# Patient Record
Sex: Female | Born: 1965
Health system: Southern US, Community
[De-identification: ages and names within clinical notes are randomized; demographics above are authoritative.]

## PROBLEM LIST (undated history)

## (undated) DIAGNOSIS — J189 Pneumonia, unspecified organism: Secondary | ICD-10-CM

## (undated) DIAGNOSIS — T7840XA Allergy, unspecified, initial encounter: Secondary | ICD-10-CM

## (undated) DIAGNOSIS — J479 Bronchiectasis, uncomplicated: Secondary | ICD-10-CM

## (undated) DIAGNOSIS — K219 Gastro-esophageal reflux disease without esophagitis: Secondary | ICD-10-CM

## (undated) DIAGNOSIS — D239 Other benign neoplasm of skin, unspecified: Secondary | ICD-10-CM

## (undated) HISTORY — PX: SHOULDER SURGERY: SHX246

## (undated) HISTORY — DX: Pneumonia, unspecified organism: J18.9

## (undated) HISTORY — DX: Gastro-esophageal reflux disease without esophagitis: K21.9

## (undated) HISTORY — PX: CARPAL TUNNEL RELEASE: SHX101

## (undated) HISTORY — PX: OTHER SURGICAL HISTORY: SHX169

## (undated) HISTORY — PX: HERNIA REPAIR: SHX51

## (undated) HISTORY — DX: Allergy, unspecified, initial encounter: T78.40XA

## (undated) HISTORY — DX: Other benign neoplasm of skin, unspecified: D23.9

## (undated) HISTORY — DX: Bronchiectasis, uncomplicated: J47.9

---

## 1998-02-04 ENCOUNTER — Emergency Department (HOSPITAL_COMMUNITY): Admission: EM | Admit: 1998-02-04 | Discharge: 1998-02-04 | Payer: Self-pay | Admitting: Emergency Medicine

## 1998-06-12 ENCOUNTER — Ambulatory Visit (HOSPITAL_COMMUNITY): Admission: RE | Admit: 1998-06-12 | Discharge: 1998-06-12 | Payer: Self-pay | Admitting: Obstetrics and Gynecology

## 1998-06-24 ENCOUNTER — Inpatient Hospital Stay (HOSPITAL_COMMUNITY): Admission: AD | Admit: 1998-06-24 | Discharge: 1998-06-24 | Payer: Self-pay | Admitting: Obstetrics and Gynecology

## 1998-08-14 ENCOUNTER — Ambulatory Visit (HOSPITAL_COMMUNITY): Admission: RE | Admit: 1998-08-14 | Discharge: 1998-08-14 | Payer: Self-pay | Admitting: Obstetrics and Gynecology

## 1998-10-23 ENCOUNTER — Inpatient Hospital Stay (HOSPITAL_COMMUNITY): Admission: AD | Admit: 1998-10-23 | Discharge: 1998-10-23 | Payer: Self-pay | Admitting: Obstetrics and Gynecology

## 1998-11-04 ENCOUNTER — Inpatient Hospital Stay (HOSPITAL_COMMUNITY): Admission: AD | Admit: 1998-11-04 | Discharge: 1998-11-04 | Payer: Self-pay | Admitting: Obstetrics and Gynecology

## 1998-11-06 ENCOUNTER — Inpatient Hospital Stay (HOSPITAL_COMMUNITY): Admission: AD | Admit: 1998-11-06 | Discharge: 1998-11-09 | Payer: Self-pay | Admitting: Obstetrics & Gynecology

## 1999-05-29 ENCOUNTER — Other Ambulatory Visit: Admission: RE | Admit: 1999-05-29 | Discharge: 1999-05-29 | Payer: Self-pay | Admitting: Family Medicine

## 1999-07-12 ENCOUNTER — Encounter: Payer: Self-pay | Admitting: Emergency Medicine

## 1999-07-12 ENCOUNTER — Emergency Department (HOSPITAL_COMMUNITY): Admission: EM | Admit: 1999-07-12 | Discharge: 1999-07-12 | Payer: Self-pay | Admitting: Emergency Medicine

## 2000-09-22 ENCOUNTER — Encounter: Admission: RE | Admit: 2000-09-22 | Discharge: 2000-11-05 | Payer: Self-pay | Admitting: Specialist

## 2005-01-09 ENCOUNTER — Emergency Department (HOSPITAL_COMMUNITY): Admission: EM | Admit: 2005-01-09 | Discharge: 2005-01-09 | Payer: Self-pay | Admitting: Emergency Medicine

## 2005-06-20 ENCOUNTER — Other Ambulatory Visit: Admission: RE | Admit: 2005-06-20 | Discharge: 2005-06-20 | Payer: Self-pay | Admitting: Family Medicine

## 2005-09-25 ENCOUNTER — Encounter: Admission: RE | Admit: 2005-09-25 | Discharge: 2005-09-25 | Payer: Self-pay | Admitting: Specialist

## 2009-10-24 ENCOUNTER — Encounter: Admission: RE | Admit: 2009-10-24 | Discharge: 2009-10-24 | Payer: Self-pay | Admitting: Family Medicine

## 2014-04-19 ENCOUNTER — Emergency Department (HOSPITAL_BASED_OUTPATIENT_CLINIC_OR_DEPARTMENT_OTHER)
Admission: EM | Admit: 2014-04-19 | Discharge: 2014-04-19 | Disposition: A | Discharge status: Home / Self Care | Payer: BC Managed Care – PPO | Attending: Emergency Medicine | Admitting: Emergency Medicine

## 2014-04-19 ENCOUNTER — Encounter (HOSPITAL_BASED_OUTPATIENT_CLINIC_OR_DEPARTMENT_OTHER): Payer: Self-pay | Admitting: Emergency Medicine

## 2014-04-19 DIAGNOSIS — W278XXA Contact with other nonpowered hand tool, initial encounter: Secondary | ICD-10-CM | POA: Insufficient documentation

## 2014-04-19 DIAGNOSIS — Z79899 Other long term (current) drug therapy: Secondary | ICD-10-CM | POA: Insufficient documentation

## 2014-04-19 DIAGNOSIS — S61209A Unspecified open wound of unspecified finger without damage to nail, initial encounter: Secondary | ICD-10-CM | POA: Insufficient documentation

## 2014-04-19 DIAGNOSIS — Z880 Allergy status to penicillin: Secondary | ICD-10-CM | POA: Insufficient documentation

## 2014-04-19 DIAGNOSIS — S61219A Laceration without foreign body of unspecified finger without damage to nail, initial encounter: Secondary | ICD-10-CM

## 2014-04-19 DIAGNOSIS — Y929 Unspecified place or not applicable: Secondary | ICD-10-CM | POA: Insufficient documentation

## 2014-04-19 DIAGNOSIS — Y9389 Activity, other specified: Secondary | ICD-10-CM | POA: Insufficient documentation

## 2014-04-19 DIAGNOSIS — Z23 Encounter for immunization: Secondary | ICD-10-CM | POA: Insufficient documentation

## 2014-04-19 MED ORDER — TETANUS-DIPHTH-ACELL PERTUSSIS 5-2.5-18.5 LF-MCG/0.5 IM SUSP
0.5000 mL | Freq: Once | INTRAMUSCULAR | Status: AC
  Administered 2014-04-19: 0.5 mL via INTRAMUSCULAR
  Filled 2014-04-19: qty 0.5

## 2014-04-19 NOTE — Discharge Instructions (Signed)
Sterile Tape Wound Care Some cuts and wounds can be closed using sterile tape, also called skin adhesive strips. Skin adhesive strips can be used for shallow (superficial) and simple cuts, wounds, lacerations, and surgical incisions. These strips act in place of stitches to hold the edges of the wound together, allowing for faster healing. Unlike stitches, the adhesive strips do not require needles or anesthetic medicine for placement. The strips will wear off naturally as the wound is healing. It is important to take proper care of your wound at home while it heals.  HOME CARE INSTRUCTIONS  Try to keep the area around your wound clean and dry. Do not allow the adhesive strips to get wet for the first 12 hours.   Do not use any soaps or ointments on the wound for the first 12 hours.   If a bandage (dressing) has been applied, follow your health care provider's instructions for how often to change the dressing. Keep the dressing dry if one has been applied.   Do not remove the adhesive strips. They will fall off on their own. If they do not, you may remove them gently after 10 days. You should gently wet the strips before removing them. For example, this can be done in the shower.  Do not scratch, pick, or rub the wound area.   Protect the wound from further injury until it is healed.   Protect the wound from sun and tanning bed exposure while it is healing and for several weeks after healing.   Only take over-the-counter or prescription medicines as directed by your health care provider.   Keep all follow-up appointments as directed by your health care provider.  SEEK MEDICAL CARE IF: Your adhesive strips become wet or soaked with blood before the wound has healed. The tape will need to be replaced.  SEEK IMMEDIATE MEDICAL CARE IF:  You have increasing pain in the wound.   You develop a rash after the strips are applied.  Your wound becomes red, swollen, hot, or tender.   You  have a red streak that goes away from the wound.   You have pus coming from the wound.   You have increased bleeding from the wound.  You notice a bad smell coming from the wound.   Your wound breaks open. MAKE SURE YOU:  Understand these instructions.  Will watch your condition.  Will get help right away if you are not doing well or get worse. Document Released: 10/17/2004 Document Revised: 06/30/2013 Document Reviewed: 03/31/2013 ExitCare Patient Information 2015 [employer]. This information is not intended to replace advice given to you by your health care provider. Make sure you discuss any questions you have with your health care provider.  

## 2014-04-19 NOTE — ED Notes (Signed)
Pt reports was getting tylenol out and did not realize there was an axe stored in area.  Sustained laceration to right fourth digit. Bleeding currently controlled with dressing in place.

## 2014-04-19 NOTE — ED Provider Notes (Signed)
CSN: [id number]     Arrival date & time 04/19/14  2017 History   First MD Initiated Contact with Patient 04/19/14 2053     Chief Complaint  Patient presents with  . Extremity Laceration     (Consider location/radiation/quality/duration/timing/severity/associated sxs/prior Treatment) Patient is a 48 y.o. female presenting with hand pain. The history is provided by the patient. No language interpreter was used.  Hand Pain This is a new problem. The current episode started today. The problem occurs constantly. The problem has been unchanged. Pertinent negatives include no numbness. Nothing aggravates the symptoms. She has tried nothing for the symptoms. The treatment provided no relief.  Pt reports she cut her hand on an axe.   Pt complains of laceration to right ring finger History reviewed. No pertinent past medical history. Past Surgical History  Procedure Laterality Date  . Cesarean section    . Carpal tunnel release    . Shoulder surgery    . Uterine ablation     No family history on file. History  Substance Use Topics  . Smoking status: Never Smoker   . Smokeless tobacco: Not on file  . Alcohol Use: No   OB History   Grav Para Term Preterm Abortions TAB SAB Ect Mult Living                 Review of Systems  Neurological: Negative for numbness.  All other systems reviewed and are negative.     Allergies  Ampicillin and Erythromycin  Home Medications   Prior to Admission medications   Medication Sig Start Date End Date Taking? Authorizing Provider  cetirizine (ZYRTEC) 10 MG tablet Take 10 mg by mouth daily.   Yes Historical Provider, MD  Multiple Vitamin (MULTIVITAMIN) tablet Take 1 tablet by mouth daily.   Yes Historical Provider, MD   BP 115/75  Pulse 76  Temp(Src) 98.3 F (36.8 C) (Oral)  Resp 18  Ht 5\' 3"  (1.6 m)  Wt 165 lb (74.844 kg)  BMI 29.24 kg/m2  SpO2 100% Physical Exam  Constitutional: She is oriented to person, place, and time. She appears  well-developed and well-nourished.  HENT:  Head: Normocephalic.  Cardiovascular: Normal rate.   Pulmonary/Chest: Effort normal.  Musculoskeletal: She exhibits tenderness.  59mm round laceration flap no gapping  nv and ns intact  Neurological: She is alert and oriented to person, place, and time. She has normal reflexes.  Skin: Skin is warm.  Psychiatric: She has a normal mood and affect.    ED Course  Procedures (including critical care time) Labs Review Labs Reviewed - No data to display  Imaging Review No results found.   EKG Interpretation None      MDM   Final diagnoses:  Laceration of finger, right, initial encounter    Tetanus,  steristrips to wound    Fransico Meadow, PA-C 04/19/14 2119

## 2014-04-20 NOTE — ED Provider Notes (Signed)
Medical screening examination/treatment/procedure(s) were performed by non-physician practitioner and as supervising physician I was immediately available for consultation/collaboration.   EKG Interpretation None        Blanchard Kelch, MD 04/20/14 1032

## 2014-06-20 ENCOUNTER — Other Ambulatory Visit: Payer: Self-pay | Admitting: Family Medicine

## 2014-06-20 DIAGNOSIS — R1084 Generalized abdominal pain: Secondary | ICD-10-CM

## 2014-06-24 ENCOUNTER — Ambulatory Visit
Admission: RE | Admit: 2014-06-24 | Discharge: 2014-06-24 | Disposition: A | Discharge status: Home / Self Care | Payer: BC Managed Care – PPO | Source: Ambulatory Visit | Attending: Family Medicine | Admitting: Family Medicine

## 2014-06-24 DIAGNOSIS — R1084 Generalized abdominal pain: Secondary | ICD-10-CM

## 2016-02-13 DIAGNOSIS — D2261 Melanocytic nevi of right upper limb, including shoulder: Secondary | ICD-10-CM | POA: Diagnosis not present

## 2016-02-13 DIAGNOSIS — L700 Acne vulgaris: Secondary | ICD-10-CM | POA: Diagnosis not present

## 2016-02-13 DIAGNOSIS — D2272 Melanocytic nevi of left lower limb, including hip: Secondary | ICD-10-CM | POA: Diagnosis not present

## 2016-02-13 DIAGNOSIS — D2271 Melanocytic nevi of right lower limb, including hip: Secondary | ICD-10-CM | POA: Diagnosis not present

## 2016-03-13 DIAGNOSIS — Z1231 Encounter for screening mammogram for malignant neoplasm of breast: Secondary | ICD-10-CM | POA: Diagnosis not present

## 2016-03-13 DIAGNOSIS — Z01419 Encounter for gynecological examination (general) (routine) without abnormal findings: Secondary | ICD-10-CM | POA: Diagnosis not present

## 2016-03-13 DIAGNOSIS — N951 Menopausal and female climacteric states: Secondary | ICD-10-CM | POA: Diagnosis not present

## 2016-03-13 DIAGNOSIS — Z6831 Body mass index (BMI) 31.0-31.9, adult: Secondary | ICD-10-CM | POA: Diagnosis not present

## 2016-12-27 ENCOUNTER — Encounter (INDEPENDENT_AMBULATORY_CARE_PROVIDER_SITE_OTHER): Payer: Self-pay | Admitting: *Deleted

## 2016-12-27 NOTE — Telephone Encounter (Addendum)
Error

## 2016-12-27 NOTE — Telephone Encounter (Signed)
This encounter was created in error - please disregard.

## 2017-02-07 ENCOUNTER — Emergency Department (HOSPITAL_BASED_OUTPATIENT_CLINIC_OR_DEPARTMENT_OTHER)
Admission: EM | Admit: 2017-02-07 | Discharge: 2017-02-07 | Disposition: A | Discharge status: Home / Self Care | Payer: BLUE CROSS/BLUE SHIELD | Attending: Emergency Medicine | Admitting: Emergency Medicine

## 2017-02-07 ENCOUNTER — Encounter (HOSPITAL_BASED_OUTPATIENT_CLINIC_OR_DEPARTMENT_OTHER): Payer: Self-pay | Admitting: Emergency Medicine

## 2017-02-07 ENCOUNTER — Emergency Department (HOSPITAL_BASED_OUTPATIENT_CLINIC_OR_DEPARTMENT_OTHER): Payer: BLUE CROSS/BLUE SHIELD

## 2017-02-07 DIAGNOSIS — R1031 Right lower quadrant pain: Secondary | ICD-10-CM | POA: Diagnosis not present

## 2017-02-07 DIAGNOSIS — N3000 Acute cystitis without hematuria: Secondary | ICD-10-CM | POA: Diagnosis not present

## 2017-02-07 LAB — COMPREHENSIVE METABOLIC PANEL
ALBUMIN: 3.8 g/dL (ref 3.5–5.0)
ALT: 14 U/L (ref 14–54)
AST: 20 U/L (ref 15–41)
Alkaline Phosphatase: 107 U/L (ref 38–126)
Anion gap: 8 (ref 5–15)
BILIRUBIN TOTAL: 0.2 mg/dL — AB (ref 0.3–1.2)
BUN: 21 mg/dL — AB (ref 6–20)
CO2: 24 mmol/L (ref 22–32)
Calcium: 9.1 mg/dL (ref 8.9–10.3)
Chloride: 104 mmol/L (ref 101–111)
Creatinine, Ser: 0.75 mg/dL (ref 0.44–1.00)
GFR calc Af Amer: >60 mL/min (ref >60)
GFR calc non Af Amer: >60 mL/min (ref >60)
Glucose, Bld: 102 mg/dL — ABNORMAL HIGH (ref 65–99)
Potassium: 4 mmol/L (ref 3.5–5.1)
Sodium: 136 mmol/L (ref 135–145)
TOTAL PROTEIN: 6.9 g/dL (ref 6.5–8.1)

## 2017-02-07 LAB — URINALYSIS, ROUTINE W REFLEX MICROSCOPIC
Bilirubin Urine: NEGATIVE
GLUCOSE, UA: NEGATIVE mg/dL
Ketones, ur: NEGATIVE mg/dL
Nitrite: NEGATIVE
PROTEIN: NEGATIVE mg/dL
Specific Gravity, Urine: 1.021 (ref 1.005–1.030)
pH: 5.5 (ref 5.0–8.0)

## 2017-02-07 LAB — CBC
HCT: 41 % (ref 36.0–46.0)
Hemoglobin: 14 g/dL (ref 12.0–15.0)
MCH: 31.9 pg (ref 26.0–34.0)
MCHC: 34.1 g/dL (ref 30.0–36.0)
MCV: 93.4 fL (ref 78.0–100.0)
Platelets: 318 10*3/uL (ref 150–400)
RBC: 4.39 MIL/uL (ref 3.87–5.11)
RDW: 12.6 % (ref 11.5–15.5)
WBC: 9.3 10*3/uL (ref 4.0–10.5)

## 2017-02-07 LAB — URINALYSIS, MICROSCOPIC (REFLEX)

## 2017-02-07 LAB — LIPASE, BLOOD: Lipase: 21 U/L (ref 11–51)

## 2017-02-07 MED ORDER — KETOROLAC TROMETHAMINE 30 MG/ML IJ SOLN: 30.0000 mg | Freq: Once | INTRAMUSCULAR | Status: DC | PRN

## 2017-02-07 MED ORDER — NITROFURANTOIN MONOHYD MACRO 100 MG PO CAPS: 100.0000 mg | ORAL_CAPSULE | Freq: Two times a day (BID) | ORAL | 0 refills | Status: AC

## 2017-02-07 MED ORDER — IOPAMIDOL (ISOVUE-300) INJECTION 61%
100.0000 mL | Freq: Once | INTRAVENOUS | Status: AC | PRN
  Administered 2017-02-07: 100 mL via INTRAVENOUS

## 2017-02-07 MED ORDER — ONDANSETRON 8 MG PO TBDP
8.0000 mg | ORAL_TABLET | Freq: Once | ORAL | Status: AC
  Administered 2017-02-07: 8 mg via ORAL
  Filled 2017-02-07: qty 1

## 2017-02-07 NOTE — Discharge Instructions (Signed)
Please read instructions below. Take your antibiotic 2 times per day until they are gone. You can take advil or tylenol as needed for pain. Follow up with your primary care provider about your abdominal pain. Return to the ER if you develop a fever, stop having bowel movements, or for new or worsening symptoms.

## 2017-02-07 NOTE — ED Notes (Signed)
Pt does not want a pelvic exam, EDP aware

## 2017-02-07 NOTE — ED Notes (Signed)
Pt ambulatory to bathroom

## 2017-02-07 NOTE — ED Triage Notes (Addendum)
Sent from PCP, RLQ since 430 this morning with nausea. Negative markle sign

## 2017-02-07 NOTE — ED Notes (Signed)
Pt verbalizes understanding of d/c instructions and denies any further needs at this time. 

## 2017-02-07 NOTE — ED Provider Notes (Signed)
[place] [facility] Provider Note   CSN: [id number] Arrival date & time: 02/07/17  1739   By signing my name below, I, Eunice Blase, attest that this documentation has been prepared under the direction and in the presence of [place] R Russo, PA-C. Electronically Signed: Eunice Blase, Scribe. 02/07/17. 8:27 PM.   History   Chief Complaint Chief Complaint  Patient presents with  . Abdominal Pain   The history is provided by the patient and medical records. No language interpreter was used.    Monique Wang is a 51 y.o. female with h/o uterine ablation, kidney stones, hernia and C-section who presents to the Emergency Department with concern for RLQ abdominal pain onset 0430 today. Nausea noted since lunch today; decreased appetite, urinary frequency, mild fever that subsided without intervention also noted. Pt evaluated by PCP PTA and advised to report to [facility] ED to R/O any emergent situation. She describes a dull 3/10 ache at rest that becomes moderate and sharp with certain movements and cough. No pain medications taken at home. No other modifying factors noted.  Pt using estrogen hormone patch, zyrtec and multivitamin supplement daily. No diarrhea, vaginal bleeding, chills, N/V, SOB, headache, weakness, constipation, chronic disorders and bloody stool. No hx cholecystectomy or appendectomy. No other complaints at this time.   History reviewed. No pertinent past medical history.  There are no active problems to display for this patient.   Past Surgical History:  Procedure Laterality Date  . CARPAL TUNNEL RELEASE    . CESAREAN SECTION    . HERNIA REPAIR    . SHOULDER SURGERY    . uterine ablation      OB History    No data available       Home Medications    Prior to Admission medications   Medication Sig Start Date End Date Taking? Authorizing Provider  cetirizine (ZYRTEC) 10 MG tablet Take 10 mg by mouth daily.    [provider]  Multiple Vitamin  (MULTIVITAMIN) tablet Take 1 tablet by mouth daily.    [provider]  nitrofurantoin, macrocrystal-monohydrate, (MACROBID) 100 MG capsule Take 1 capsule (100 mg total) by mouth 2 (two) times daily. 02/07/17 02/14/17  Russo, [place] N, PA-C    Family History No family history on file.  Social History Social History  Substance Use Topics  . Smoking status: Never Smoker  . Smokeless tobacco: Never Used  . Alcohol use No     Allergies   Ampicillin and Erythromycin   Review of Systems Review of Systems  Constitutional: Positive for appetite change and fever. Negative for chills.  Respiratory: Negative for shortness of breath.   Cardiovascular: Negative for chest pain.  Gastrointestinal: Positive for abdominal pain and nausea. Negative for constipation and diarrhea.  Endocrine: Negative for polydipsia.  Genitourinary: Positive for frequency. Negative for difficulty urinating and vaginal bleeding.  Musculoskeletal: Negative for back pain.  Skin: Negative for color change.  Allergic/Immunologic: Negative for immunocompromised state.  Neurological: Negative for weakness and headaches.     Physical Exam Updated Vital Signs BP 120/74 (BP Location: Left Arm)   Pulse 70   Temp 98.6 F (37 C) (Oral)   Resp 12   Ht 5\' 3"  (1.6 m)   Wt 171 lb (77.6 kg)   SpO2 99%   BMI 30.29 kg/m   Physical Exam  Constitutional: She appears well-developed and well-nourished.  HENT:  Head: Normocephalic and atraumatic.  Eyes: Conjunctivae are normal. Pupils are equal, round, and reactive to light.  Neck: Normal range of motion. Neck supple.  Cardiovascular: Normal rate, regular rhythm, normal heart sounds and intact distal pulses.   Pulmonary/Chest: Effort normal and breath sounds normal. She has no wheezes. She has no rales.  Abdominal: Soft. Bowel sounds are normal. She exhibits no distension. There is tenderness (mild) in the right lower quadrant and periumbilical area. There is no  rebound, no guarding and no CVA tenderness (bilaterally).  Peritoneal signs  Neurological: She is alert.  Skin: Skin is warm.  Psychiatric: She has a normal mood and affect. Her behavior is normal.  Nursing note and vitals reviewed.    ED Treatments / Results  DIAGNOSTIC STUDIES: Oxygen Saturation is 99% on RA, NL by my interpretation.    COORDINATION OF CARE: 8:07 PM-Discussed next steps with pt. Pt verbalized understanding and is agreeable with the plan. Will order imaging, labs and PRN pain medication.   Labs (all labs ordered are listed, but only abnormal results are displayed) Labs Reviewed  URINALYSIS, ROUTINE W REFLEX MICROSCOPIC - Abnormal; Notable for the following:       Result Value   APPearance CLOUDY (*)    Hgb urine dipstick MODERATE (*)    Leukocytes, UA LARGE (*)    All other components within normal limits  URINALYSIS, MICROSCOPIC (REFLEX) - Abnormal; Notable for the following:    Bacteria, UA FEW (*)    Squamous Epithelial / LPF 0-5 (*)    All other components within normal limits  COMPREHENSIVE METABOLIC PANEL - Abnormal; Notable for the following:    Glucose, Bld 102 (*)    BUN 21 (*)    Total Bilirubin 0.2 (*)    All other components within normal limits  LIPASE, BLOOD  CBC    EKG  EKG Interpretation None       Radiology Ct Abdomen Pelvis W Contrast  Result Date: 02/07/2017 CLINICAL DATA:  Right lower quadrant pain with nausea EXAM: CT ABDOMEN AND PELVIS WITH CONTRAST TECHNIQUE: Multidetector CT imaging of the abdomen and pelvis was performed using the standard protocol following bolus administration of intravenous contrast. CONTRAST:  114mL ISOVUE-300 IOPAMIDOL (ISOVUE-300) INJECTION 61% COMPARISON:  01/09/2005 FINDINGS: Lower chest: Lung bases demonstrate no acute consolidation or effusion. Normal heart size. Hepatobiliary: No focal liver abnormality is seen. No gallstones, gallbladder wall thickening, or biliary dilatation. Pancreas:  Unremarkable. No pancreatic ductal dilatation or surrounding inflammatory changes. Spleen: Normal in size without focal abnormality. Adrenals/Urinary Tract: Adrenal glands are unremarkable. Kidneys are normal, without renal calculi, focal lesion, or hydronephrosis. Bladder is unremarkable. Stomach/Bowel: The stomach is nonenlarged. There is no dilated small bowel. No colon wall thickening. Probable normal appendix on sagittal views, series 6, image number 28. No right lower quadrant inflammatory process. Vascular/Lymphatic: No significant vascular findings are present. No enlarged abdominal or pelvic lymph nodes. Reproductive: Uterus and bilateral adnexa are unremarkable. Other: No free air or free fluid. Musculoskeletal: No acute or significant osseous findings. IMPRESSION: No definite CT evidence for acute intra-abdominal or pelvic pathology. Electronically Signed   By: Donavan Foil M.D.   On: 02/07/2017 21:42    Procedures Procedures (including critical care time)  Medications Ordered in ED Medications  ketorolac (TORADOL) 30 MG/ML injection 30 mg (not administered)  ondansetron (ZOFRAN-ODT) disintegrating tablet 8 mg (8 mg Oral Given 02/07/17 2017)  iopamidol (ISOVUE-300) 61 % injection 100 mL (100 mLs Intravenous Contrast Given 02/07/17 2119)     Initial Impression / Assessment and Plan / ED Course  I have reviewed the triage vital  signs and the nursing notes.  Pertinent labs & imaging results that were available during my care of the patient were reviewed by me and considered in my medical decision making (see chart for details).     Pt w RLQ abdominal pain and UTI. Patient is nontoxic, nonseptic appearing, in no apparent distress.  Patient's pain and other symptoms adequately managed in emergency department.  Labs, imaging and vitals reviewed.  Patient does not meet the SIRS or Sepsis criteria.  On repeat exam patient does not have a surgical abdomen.  CT neg for appendicitis, bowel  obstruction, bowel perforation, cholecystitis, diverticulitis. Pt refused pelvic exam and requesting to leave. Will tx UTI with macrobid. Patient discharged home with symptomatic treatment and given strict instructions for follow-up with their primary care physician.  Pt safe for discharge. Patient discussed with Dr. Johnney Killian. Discussed results, findings, treatment and follow up. Patient advised of return precautions. Patient verbalized understanding and agreed with plan.   Final Clinical Impressions(s) / ED Diagnoses   Final diagnoses:  RLQ abdominal pain  Acute cystitis without hematuria    New Prescriptions Discharge Medication List as of 02/07/2017 10:28 PM    START taking these medications   Details  nitrofurantoin, macrocrystal-monohydrate, (MACROBID) 100 MG capsule Take 1 capsule (100 mg total) by mouth 2 (two) times daily., Starting Fri 02/07/2017, Until Fri 02/14/2017, Print      I personally performed the services described in this documentation, which was scribed in my presence. The recorded information has been reviewed and is accurate.    Russo, [place] N, PA-C 02/07/17 2254    Charlesetta Shanks, MD 02/08/17 [phone]

## 2017-03-17 DIAGNOSIS — Z6830 Body mass index (BMI) 30.0-30.9, adult: Secondary | ICD-10-CM | POA: Diagnosis not present

## 2017-03-17 DIAGNOSIS — Z1231 Encounter for screening mammogram for malignant neoplasm of breast: Secondary | ICD-10-CM | POA: Diagnosis not present

## 2017-03-17 DIAGNOSIS — R8299 Other abnormal findings in urine: Secondary | ICD-10-CM | POA: Diagnosis not present

## 2017-03-17 DIAGNOSIS — N390 Urinary tract infection, site not specified: Secondary | ICD-10-CM | POA: Diagnosis not present

## 2017-03-17 DIAGNOSIS — Z01419 Encounter for gynecological examination (general) (routine) without abnormal findings: Secondary | ICD-10-CM | POA: Diagnosis not present

## 2017-04-15 DIAGNOSIS — R87615 Unsatisfactory cytologic smear of cervix: Secondary | ICD-10-CM | POA: Diagnosis not present

## 2017-08-27 DIAGNOSIS — D225 Melanocytic nevi of trunk: Secondary | ICD-10-CM | POA: Diagnosis not present

## 2017-08-27 DIAGNOSIS — D2261 Melanocytic nevi of right upper limb, including shoulder: Secondary | ICD-10-CM | POA: Diagnosis not present

## 2017-08-27 DIAGNOSIS — L732 Hidradenitis suppurativa: Secondary | ICD-10-CM | POA: Diagnosis not present

## 2017-08-27 DIAGNOSIS — Z86018 Personal history of other benign neoplasm: Secondary | ICD-10-CM | POA: Diagnosis not present

## 2018-02-06 DIAGNOSIS — L309 Dermatitis, unspecified: Secondary | ICD-10-CM | POA: Diagnosis not present

## 2018-02-06 DIAGNOSIS — L989 Disorder of the skin and subcutaneous tissue, unspecified: Secondary | ICD-10-CM | POA: Diagnosis not present

## 2018-02-24 DIAGNOSIS — L309 Dermatitis, unspecified: Secondary | ICD-10-CM | POA: Diagnosis not present

## 2018-02-24 DIAGNOSIS — L920 Granuloma annulare: Secondary | ICD-10-CM | POA: Diagnosis not present

## 2018-03-17 DIAGNOSIS — Z0142 Encounter for cervical smear to confirm findings of recent normal smear following initial abnormal smear: Secondary | ICD-10-CM | POA: Diagnosis not present

## 2018-03-17 DIAGNOSIS — Z1231 Encounter for screening mammogram for malignant neoplasm of breast: Secondary | ICD-10-CM | POA: Diagnosis not present

## 2018-03-17 DIAGNOSIS — Z01419 Encounter for gynecological examination (general) (routine) without abnormal findings: Secondary | ICD-10-CM | POA: Diagnosis not present

## 2018-03-17 DIAGNOSIS — Z6831 Body mass index (BMI) 31.0-31.9, adult: Secondary | ICD-10-CM | POA: Diagnosis not present

## 2018-03-31 DIAGNOSIS — R87615 Unsatisfactory cytologic smear of cervix: Secondary | ICD-10-CM | POA: Diagnosis not present

## 2018-08-21 DIAGNOSIS — J180 Bronchopneumonia, unspecified organism: Secondary | ICD-10-CM | POA: Diagnosis not present

## 2018-09-18 DIAGNOSIS — R05 Cough: Secondary | ICD-10-CM | POA: Diagnosis not present

## 2018-09-18 DIAGNOSIS — J069 Acute upper respiratory infection, unspecified: Secondary | ICD-10-CM | POA: Diagnosis not present

## 2018-10-22 DIAGNOSIS — H903 Sensorineural hearing loss, bilateral: Secondary | ICD-10-CM | POA: Diagnosis not present

## 2018-10-22 DIAGNOSIS — H6981 Other specified disorders of Eustachian tube, right ear: Secondary | ICD-10-CM | POA: Diagnosis not present

## 2019-02-15 DIAGNOSIS — Z23 Encounter for immunization: Secondary | ICD-10-CM | POA: Diagnosis not present

## 2019-02-15 DIAGNOSIS — S80212A Abrasion, left knee, initial encounter: Secondary | ICD-10-CM | POA: Diagnosis not present

## 2019-04-01 DIAGNOSIS — Z01419 Encounter for gynecological examination (general) (routine) without abnormal findings: Secondary | ICD-10-CM | POA: Diagnosis not present

## 2019-04-01 DIAGNOSIS — Z6831 Body mass index (BMI) 31.0-31.9, adult: Secondary | ICD-10-CM | POA: Diagnosis not present

## 2019-04-01 DIAGNOSIS — Z1231 Encounter for screening mammogram for malignant neoplasm of breast: Secondary | ICD-10-CM | POA: Diagnosis not present

## 2019-05-14 DIAGNOSIS — M76891 Other specified enthesopathies of right lower limb, excluding foot: Secondary | ICD-10-CM | POA: Diagnosis not present

## 2019-05-14 DIAGNOSIS — M7062 Trochanteric bursitis, left hip: Secondary | ICD-10-CM | POA: Diagnosis not present

## 2019-05-14 DIAGNOSIS — M25551 Pain in right hip: Secondary | ICD-10-CM | POA: Diagnosis not present

## 2019-05-14 DIAGNOSIS — M7061 Trochanteric bursitis, right hip: Secondary | ICD-10-CM | POA: Diagnosis not present

## 2019-05-27 DIAGNOSIS — M25551 Pain in right hip: Secondary | ICD-10-CM | POA: Diagnosis not present

## 2019-05-27 DIAGNOSIS — M25552 Pain in left hip: Secondary | ICD-10-CM | POA: Diagnosis not present

## 2019-06-07 DIAGNOSIS — Z0142 Encounter for cervical smear to confirm findings of recent normal smear following initial abnormal smear: Secondary | ICD-10-CM | POA: Diagnosis not present

## 2019-06-07 DIAGNOSIS — R87615 Unsatisfactory cytologic smear of cervix: Secondary | ICD-10-CM | POA: Diagnosis not present

## 2019-06-19 DIAGNOSIS — L255 Unspecified contact dermatitis due to plants, except food: Secondary | ICD-10-CM | POA: Diagnosis not present

## 2019-07-01 DIAGNOSIS — L237 Allergic contact dermatitis due to plants, except food: Secondary | ICD-10-CM | POA: Diagnosis not present

## 2019-09-22 DIAGNOSIS — Z86018 Personal history of other benign neoplasm: Secondary | ICD-10-CM | POA: Diagnosis not present

## 2019-09-22 DIAGNOSIS — D225 Melanocytic nevi of trunk: Secondary | ICD-10-CM | POA: Diagnosis not present

## 2019-09-22 DIAGNOSIS — D2261 Melanocytic nevi of right upper limb, including shoulder: Secondary | ICD-10-CM | POA: Diagnosis not present

## 2019-09-22 DIAGNOSIS — D2271 Melanocytic nevi of right lower limb, including hip: Secondary | ICD-10-CM | POA: Diagnosis not present

## 2019-12-29 DIAGNOSIS — Z20828 Contact with and (suspected) exposure to other viral communicable diseases: Secondary | ICD-10-CM | POA: Diagnosis not present

## 2019-12-31 DIAGNOSIS — Z20822 Contact with and (suspected) exposure to covid-19: Secondary | ICD-10-CM | POA: Diagnosis not present

## 2020-01-03 DIAGNOSIS — U071 COVID-19: Secondary | ICD-10-CM | POA: Diagnosis not present

## 2020-01-10 DIAGNOSIS — U071 COVID-19: Secondary | ICD-10-CM | POA: Diagnosis not present

## 2020-01-10 DIAGNOSIS — R05 Cough: Secondary | ICD-10-CM | POA: Diagnosis not present

## 2020-01-10 DIAGNOSIS — Z9189 Other specified personal risk factors, not elsewhere classified: Secondary | ICD-10-CM | POA: Diagnosis not present

## 2020-01-13 DIAGNOSIS — R0602 Shortness of breath: Secondary | ICD-10-CM | POA: Diagnosis not present

## 2020-01-13 DIAGNOSIS — J069 Acute upper respiratory infection, unspecified: Secondary | ICD-10-CM | POA: Diagnosis not present

## 2020-01-13 DIAGNOSIS — Z1159 Encounter for screening for other viral diseases: Secondary | ICD-10-CM | POA: Diagnosis not present

## 2020-01-13 DIAGNOSIS — R05 Cough: Secondary | ICD-10-CM | POA: Diagnosis not present

## 2020-01-22 DIAGNOSIS — U071 COVID-19: Secondary | ICD-10-CM

## 2020-01-22 HISTORY — DX: COVID-19: U07.1

## 2020-01-24 DIAGNOSIS — R0600 Dyspnea, unspecified: Secondary | ICD-10-CM | POA: Diagnosis not present

## 2020-01-24 DIAGNOSIS — R0602 Shortness of breath: Secondary | ICD-10-CM | POA: Diagnosis not present

## 2020-01-24 DIAGNOSIS — U071 COVID-19: Secondary | ICD-10-CM | POA: Diagnosis not present

## 2020-01-24 DIAGNOSIS — J189 Pneumonia, unspecified organism: Secondary | ICD-10-CM | POA: Diagnosis not present

## 2020-01-25 DIAGNOSIS — U071 COVID-19: Secondary | ICD-10-CM | POA: Diagnosis not present

## 2020-01-31 DIAGNOSIS — R9431 Abnormal electrocardiogram [ECG] [EKG]: Secondary | ICD-10-CM | POA: Diagnosis not present

## 2020-01-31 DIAGNOSIS — R002 Palpitations: Secondary | ICD-10-CM | POA: Diagnosis not present

## 2020-01-31 DIAGNOSIS — R0602 Shortness of breath: Secondary | ICD-10-CM | POA: Diagnosis not present

## 2020-01-31 DIAGNOSIS — B948 Sequelae of other specified infectious and parasitic diseases: Secondary | ICD-10-CM | POA: Diagnosis not present

## 2020-01-31 DIAGNOSIS — U071 COVID-19: Secondary | ICD-10-CM | POA: Diagnosis not present

## 2020-01-31 DIAGNOSIS — R0600 Dyspnea, unspecified: Secondary | ICD-10-CM | POA: Diagnosis not present

## 2020-02-14 DIAGNOSIS — R05 Cough: Secondary | ICD-10-CM | POA: Diagnosis not present

## 2020-02-14 DIAGNOSIS — J189 Pneumonia, unspecified organism: Secondary | ICD-10-CM | POA: Diagnosis not present

## 2020-02-14 DIAGNOSIS — R0600 Dyspnea, unspecified: Secondary | ICD-10-CM | POA: Diagnosis not present

## 2020-02-14 DIAGNOSIS — R002 Palpitations: Secondary | ICD-10-CM | POA: Diagnosis not present

## 2020-03-02 ENCOUNTER — Ambulatory Visit (INDEPENDENT_AMBULATORY_CARE_PROVIDER_SITE_OTHER): Payer: BC Managed Care – PPO | Admitting: Emergency Medicine

## 2020-03-02 ENCOUNTER — Encounter: Payer: Self-pay | Admitting: Emergency Medicine

## 2020-03-02 ENCOUNTER — Other Ambulatory Visit: Payer: Self-pay

## 2020-03-02 DIAGNOSIS — J1282 Pneumonia due to coronavirus disease 2019: Secondary | ICD-10-CM

## 2020-03-02 DIAGNOSIS — R05 Cough: Secondary | ICD-10-CM | POA: Diagnosis not present

## 2020-03-02 DIAGNOSIS — J849 Interstitial pulmonary disease, unspecified: Secondary | ICD-10-CM | POA: Insufficient documentation

## 2020-03-02 DIAGNOSIS — U071 COVID-19: Secondary | ICD-10-CM | POA: Insufficient documentation

## 2020-03-02 DIAGNOSIS — Z23 Encounter for immunization: Secondary | ICD-10-CM | POA: Diagnosis not present

## 2020-03-02 DIAGNOSIS — R053 Chronic cough: Secondary | ICD-10-CM | POA: Insufficient documentation

## 2020-03-02 MED ORDER — HYDROCODONE-HOMATROPINE 5-1.5 MG/5ML PO SYRP: 5.0000 mL | ORAL_SOLUTION | Freq: Four times a day (QID) | ORAL | 0 refills | Status: DC | PRN

## 2020-03-02 MED ORDER — BENZONATATE 100 MG PO CAPS: 100.0000 mg | ORAL_CAPSULE | Freq: Four times a day (QID) | ORAL | 1 refills | Status: DC | PRN

## 2020-03-02 NOTE — Progress Notes (Signed)
Subjective:    Patient ID: Monique Wang, female    DOB: 1965-11-21, 54 y.o.   MRN: [id number]  HPI 54 year old never smoker with little past medical history. She is referred today for he COVID19, persistent sx and abnormal CXR  Started as flu-like sx, tested positive 4/12, had exposure to positive family members. Not admitted - was treated with steroids. Felt bad for over 2 weeks, treated again with steroids and abx. She has improved to large degree. Still has cough and some exertional SOB. Minimal GERD sx on PPI qd. Mild background allergies on allegra. She was given albuterol, an ICS. She has had some chest tightness that may have helped from albuterol.   High Res Ct chest 5/4 reviewed by me shows some scattered patchy groundglass infiltrates most notable dependently in the upper lobes, bilateral lower lobes.  Most prominent with some nodular foci in the right lower lobe.  Echocardiogram report from 01/31/2020 with normal LV and RV function, normal valves    Review of Systems As per HPI  Past Medical History:  Diagnosis Date  . Bronchiectasis ([place])   . Pneumonia      Family History  Problem Relation Age of Onset  . Hypertension Mother   . Hyperlipidemia Father   . Stroke Father      Social History   Socioeconomic History  . Marital status: Married    Spouse name: Not on file  . Number of children: Not on file  . Years of education: Not on file  . Highest education level: Not on file  Occupational History  . Not on file  Tobacco Use  . Smoking status: Never Smoker  . Smokeless tobacco: Never Used  Substance and Sexual Activity  . Alcohol use: No  . Drug use: No  . Sexual activity: Not on file  Other Topics Concern  . Not on file  Social History Narrative  . Not on file   Social Determinants of Health   Financial Resource Strain:   . Difficulty of Paying Living Expenses:   Food Insecurity:   . Worried About Charity fundraiser in the Last Year:   . Arts development officer in the Last Year:   Transportation Needs:   . Film/video editor (Medical):   Marland Kitchen Lack of Transportation (Non-Medical):   Physical Activity:   . Days of Exercise per Week:   . Minutes of Exercise per Session:   Stress:   . Feeling of Stress :   Social Connections:   . Frequency of Communication with Friends and Family:   . Frequency of Social Gatherings with Friends and Family:   . Attends Religious Services:   . Active Member of Clubs or Organizations:   . Attends Archivist Meetings:   Marland Kitchen Marital Status:   Intimate Partner Violence:   . Fear of Current or Ex-Partner:   . Emotionally Abused:   Marland Kitchen Physically Abused:   . Sexually Abused:      Allergies  Allergen Reactions  . Ampicillin Rash  . Erythromycin Rash     Outpatient Medications Prior to Visit  Medication Sig Dispense Refill  . albuterol (VENTOLIN HFA) 108 (90 Base) MCG/ACT inhaler Inhale into the lungs every 6 (six) hours as needed for wheezing or shortness of breath.    . cholecalciferol (VITAMIN D3) 25 MCG (1000 UNIT) tablet Take 1,000 Units by mouth daily.    . fexofenadine (ALLEGRA) 180 MG tablet Take 180 mg by mouth daily.    Marland Kitchen  Multiple Vitamin (MULTIVITAMIN) tablet Take 1 tablet by mouth daily.    . pantoprazole (PROTONIX) 40 MG tablet Take 40 mg by mouth daily.    . progesterone (ENDOMETRIN) 100 MG vaginal insert Place 100 mg vaginally 2 (two) times daily.    . cetirizine (ZYRTEC) 10 MG tablet Take 10 mg by mouth daily. (Patient not taking: Reported on 03/02/2020)     No facility-administered medications prior to visit.        Objective:   Physical Exam Vitals:   03/02/20 1007  BP: 118/64  Pulse: 81  Temp: 98.3 F (36.8 C)  TempSrc: Oral  SpO2: 99%  Weight: 177 lb 3.2 oz (80.4 kg)  Height: 5\' 3"  (1.6 m)   Gen: Pleasant, well-nourished, in no distress,  normal affect, freq dry cough  ENT: No lesions,  mouth clear,  oropharynx clear, no postnasal drip  Neck: No JVD, no  stridor  Lungs: No use of accessory muscles, no crackles or wheezing on normal respiration, no wheeze on forced expiration  Cardiovascular: RRR, heart sounds normal, no murmur or gallops, no peripheral edema  Musculoskeletal: No deformities, no cyanosis or clubbing  Neuro: alert, awake, non focal  Skin: Warm, no lesions or rash       Assessment & Plan:  Pneumonia due to COVID-19 virus Persistent symptoms that include some dyspnea and cough.  Possible obstructive disease also based on her response to albuterol, chest tightness.  The typical course is 4 to 8 weeks, symptoms should be beginning to resolve.  Cough is her most significant symptom and will try to deal with exacerbating contributors as below.  Okay for her to return to work. She will need the vaccine 90 days after dx (July). She requests the Prevnar-13 today.  She will need PFTs she will need PFT going forward  Chronic cough Started and sustained by COVID-19.  Other sustainers is include low-level background GERD and allergic rhinitis.  We will try to treat both more aggressively for a short period of time.  Also work hard on cough suppression since cyclical cough will sustain upper airway irritation.  Ultimately she will need pulmonary function testing as described  ILD (interstitial lung disease) ([place]) With some residual groundglass infiltrate and possible evolving scar dependently on her CT scan from early May.  I will plan to repeat her scan in July to look for interval resolution.  If she has persistent groundglass infiltrates then she could be at risk for Covid related interstitial lung disease that can intermittently flare.  If groundglass is present then I think we should refer her to the ILD clinic here  Baltazar Apo, MD, PhD 03/02/2020, 10:51 AM [facility] [phone] or if no answer [phone]

## 2020-03-02 NOTE — Assessment & Plan Note (Signed)
With some residual groundglass infiltrate and possible evolving scar dependently on her CT scan from early May.  I will plan to repeat her scan in July to look for interval resolution.  If she has persistent groundglass infiltrates then she could be at risk for Covid related interstitial lung disease that can intermittently flare.  If groundglass is present then I think we should refer her to the ILD clinic here

## 2020-03-02 NOTE — Addendum Note (Signed)
Addended by: Gavin Potters R on: 03/02/2020 12:02 PM   Modules accepted: Orders

## 2020-03-02 NOTE — Assessment & Plan Note (Signed)
Started and sustained by COVID-19.  Other sustainers is include low-level background GERD and allergic rhinitis.  We will try to treat both more aggressively for a short period of time.  Also work hard on cough suppression since cyclical cough will sustain upper airway irritation.  Ultimately she will need pulmonary function testing as described

## 2020-03-02 NOTE — Assessment & Plan Note (Addendum)
Persistent symptoms that include some dyspnea and cough.  Possible obstructive disease also based on her response to albuterol, chest tightness.  The typical course is 4 to 8 weeks, symptoms should be beginning to resolve.  Cough is her most significant symptom and will try to deal with exacerbating contributors as below.  Okay for her to return to work. She will need the vaccine 90 days after dx (July). She requests the Prevnar-13 today.  She will need PFTs she will need PFT going forward

## 2020-03-02 NOTE — Patient Instructions (Addendum)
Repeat CT scan chest in early July to follow interstitial disease We will perform Pulmonary Function Testing in the future Please temporarily increase pantoprazole to 40 mg twice a day.  Take this medication 1 hour around food. Continue Allegra as you have been taking it. Add fluticasone nasal spray, 2 sprays each nostril once daily until next visit. Okay to keep albuterol available to use 2 puffs if needed for chest tightness, shortness of breath Suppress your cough is much as possible: -Use Tessalon Perles up to every 6 hours if needed for cough suppression -Use Hycodan 5 cc up to every 6 hours if needed for cough suppression Follow with Dr Lamonte Sakai in July after your CT chest to review the results together.

## 2020-03-06 ENCOUNTER — Other Ambulatory Visit: Payer: Self-pay

## 2020-03-07 ENCOUNTER — Ambulatory Visit (INDEPENDENT_AMBULATORY_CARE_PROVIDER_SITE_OTHER): Payer: BC Managed Care – PPO | Admitting: Internal Medicine

## 2020-03-07 ENCOUNTER — Encounter: Payer: Self-pay | Admitting: Internal Medicine

## 2020-03-07 ENCOUNTER — Encounter: Payer: Self-pay | Admitting: Gastroenterology

## 2020-03-07 VITALS — BP 110/70 | HR 82 | Temp 97.5°F | Ht 63.0 in | Wt 180.0 lb

## 2020-03-07 DIAGNOSIS — Z800 Family history of malignant neoplasm of digestive organs: Secondary | ICD-10-CM

## 2020-03-07 DIAGNOSIS — J1282 Pneumonia due to coronavirus disease 2019: Secondary | ICD-10-CM | POA: Diagnosis not present

## 2020-03-07 DIAGNOSIS — Z1211 Encounter for screening for malignant neoplasm of colon: Secondary | ICD-10-CM | POA: Diagnosis not present

## 2020-03-07 DIAGNOSIS — U071 COVID-19: Secondary | ICD-10-CM

## 2020-03-07 NOTE — Progress Notes (Signed)
New Patient Office Visit     This visit occurred during the SARS-CoV-2 public health emergency.  Safety protocols were in place, including screening questions prior to the visit, additional usage of staff PPE, and extensive cleaning of exam room while observing appropriate contact time as indicated for disinfecting solutions.    CC/Reason for Visit: Establish care, discuss chronic conditions Previous PCP: Marylynn Pearson, OB/GYN Last Visit: Last year  HPI: Monique Wang is a 54 y.o. female who is coming in today for the above mentioned reasons.  She has no past medical history of significance.  Unfortunately in April she contracted COVID-19 virus infection and developed Covid pneumonia.  She has been having significant issues with post Covid fatigue and coughing.  She had a CT scan that showed maybe some interstitial lung disease.  She is already following with pulmonary and has repeat CT scan and PFTs scheduled for July.  She has no acute complaints today.  She does not smoke, she does not drink, erythromycin causes hives, family history significant for mother with A. fib and hypertension, dad with coronary artery disease and colon cancer in brother died at an early age from testicular cancer.   Past Medical/Surgical History: Past Medical History:  Diagnosis Date  . Bronchiectasis ([place])   . Pneumonia     Past Surgical History:  Procedure Laterality Date  . CARPAL TUNNEL RELEASE    . CESAREAN SECTION    . HERNIA REPAIR    . SHOULDER SURGERY    . uterine ablation      Social History:  reports that she has never smoked. She has never used smokeless tobacco. She reports that she does not drink alcohol and does not use drugs.  Allergies: Allergies  Allergen Reactions  . Honey Bee Venom Protein [Bee Venom] Anaphylaxis  . Erythromycin Base Hives  . Grass Pollen(K-O-R-T-Swt Vern) Cough, Other (See Comments) and Rash  . Other Other (See Comments)  . Ampicillin Rash  .  Erythromycin Rash    Family History:  Family History  Problem Relation Age of Onset  . Hypertension Mother   . Hyperlipidemia Father   . Stroke Father      Current Outpatient Medications:  .  albuterol (VENTOLIN HFA) 108 (90 Base) MCG/ACT inhaler, Inhale into the lungs every 6 (six) hours as needed for wheezing or shortness of breath., Disp: , Rfl:  .  benzonatate (TESSALON) 100 MG capsule, Take 1 capsule (100 mg total) by mouth every 6 (six) hours as needed for cough., Disp: 30 capsule, Rfl: 1 .  cholecalciferol (VITAMIN D3) 25 MCG (1000 UNIT) tablet, Take 1,000 Units by mouth daily., Disp: , Rfl:  .  EPINEPHrine 0.3 mg/0.3 mL IJ SOAJ injection, Inject 0.3 mg into the muscle as needed for anaphylaxis., Disp: , Rfl:  .  estradiol (ESTRACE) 1 MG tablet, Take 1 mg by mouth daily., Disp: , Rfl:  .  fexofenadine (ALLEGRA) 180 MG tablet, Take 180 mg by mouth daily., Disp: , Rfl:  .  fluticasone (FLONASE) 50 MCG/ACT nasal spray, Place into both nostrils daily., Disp: , Rfl:  .  Multiple Vitamin (MULTIVITAMIN) tablet, Take 1 tablet by mouth daily., Disp: , Rfl:  .  pantoprazole (PROTONIX) 40 MG tablet, Take 40 mg by mouth daily., Disp: , Rfl:  .  progesterone (ENDOMETRIN) 100 MG vaginal insert, Place 100 mg vaginally 2 (two) times daily., Disp: , Rfl:  .  HYDROcodone-homatropine (HYCODAN) 5-1.5 MG/5ML syrup, Take 5 mLs by mouth every 6 (six)  hours as needed for cough. (Patient not taking: Reported on 03/07/2020), Disp: 240 mL, Rfl: 0  Review of Systems:  Constitutional: Denies fever, chills, diaphoresis, appetite change and fatigue.  HEENT: Denies photophobia, eye pain, redness, hearing loss, ear pain, congestion, sore throat, rhinorrhea, sneezing, mouth sores, trouble swallowing, neck pain, neck stiffness and tinnitus.   Respiratory: Denies chest tightness,  and wheezing.   Cardiovascular: Denies chest pain, palpitations and leg swelling.  Gastrointestinal: Denies nausea, vomiting, abdominal  pain, diarrhea, constipation, blood in stool and abdominal distention.  Genitourinary: Denies dysuria, urgency, frequency, hematuria, flank pain and difficulty urinating.  Endocrine: Denies: hot or cold intolerance, sweats, changes in hair or nails, polyuria, polydipsia. Musculoskeletal: Denies myalgias, back pain, joint swelling, arthralgias and gait problem.  Skin: Denies pallor, rash and wound.  Neurological: Denies dizziness, seizures, syncope, weakness, light-headedness, numbness and headaches.  Hematological: Denies adenopathy. Easy bruising, personal or family bleeding history  Psychiatric/Behavioral: Denies suicidal ideation, mood changes, confusion, nervousness, sleep disturbance and agitation    Physical Exam: Vitals:   03/07/20 1329  BP: 110/70  Pulse: 82  Temp: (!) 97.5 F (36.4 C)  TempSrc: Temporal  SpO2: 99%  Weight: 180 lb (81.6 kg)  Height: 5\' 3"  (1.6 m)   Body mass index is 31.89 kg/m.  Constitutional: NAD, calm, comfortable Eyes: PERRL, lids and conjunctivae normal ENMT: Mucous membranes are moist. Respiratory: clear to auscultation bilaterally, no wheezing, no crackles. Normal respiratory effort. No accessory muscle use.  Cardiovascular: Regular rate and rhythm, no murmurs / rubs / gallops. No extremity edema.   Neurologic: Grossly intact and nonfocal Psychiatric: Normal judgment and insight. Alert and oriented x 3. Normal mood.    Impression and Plan:  Screening for malignant neoplasm of colon  Family history of colon cancer  - Plan: Ambulatory referral to Gastroenterology  Pneumonia due to COVID-19 virus -With post Covid fatigue and cough, followed by pulmonary.  She will schedule appointment for CPE in the next 3 to 4 months.    Patient Instructions  -Nice seeing you today!!  -Schedule your physical in about 6 months.     Lelon Frohlich, MD [facility]

## 2020-03-07 NOTE — Patient Instructions (Signed)
-  Nice seeing you today!!  -Schedule your physical in about 6 months.

## 2020-03-28 ENCOUNTER — Ambulatory Visit
Admission: RE | Admit: 2020-03-28 | Discharge: 2020-03-28 | Disposition: A | Discharge status: Home / Self Care | Payer: BC Managed Care – PPO | Source: Ambulatory Visit | Attending: Emergency Medicine | Admitting: Emergency Medicine

## 2020-03-28 DIAGNOSIS — J849 Interstitial pulmonary disease, unspecified: Secondary | ICD-10-CM

## 2020-03-28 DIAGNOSIS — R918 Other nonspecific abnormal finding of lung field: Secondary | ICD-10-CM | POA: Diagnosis not present

## 2020-03-28 DIAGNOSIS — J189 Pneumonia, unspecified organism: Secondary | ICD-10-CM | POA: Diagnosis not present

## 2020-03-28 DIAGNOSIS — R0609 Other forms of dyspnea: Secondary | ICD-10-CM | POA: Diagnosis not present

## 2020-03-28 DIAGNOSIS — M47814 Spondylosis without myelopathy or radiculopathy, thoracic region: Secondary | ICD-10-CM | POA: Diagnosis not present

## 2020-04-11 DIAGNOSIS — Z6832 Body mass index (BMI) 32.0-32.9, adult: Secondary | ICD-10-CM | POA: Diagnosis not present

## 2020-04-11 DIAGNOSIS — R319 Hematuria, unspecified: Secondary | ICD-10-CM | POA: Diagnosis not present

## 2020-04-11 DIAGNOSIS — Z01419 Encounter for gynecological examination (general) (routine) without abnormal findings: Secondary | ICD-10-CM | POA: Diagnosis not present

## 2020-04-11 DIAGNOSIS — Z1213 Encounter for screening for malignant neoplasm of small intestine: Secondary | ICD-10-CM | POA: Diagnosis not present

## 2020-04-19 ENCOUNTER — Ambulatory Visit (INDEPENDENT_AMBULATORY_CARE_PROVIDER_SITE_OTHER): Payer: BC Managed Care – PPO | Admitting: Emergency Medicine

## 2020-04-19 ENCOUNTER — Encounter: Payer: Self-pay | Admitting: Emergency Medicine

## 2020-04-19 ENCOUNTER — Other Ambulatory Visit: Payer: Self-pay

## 2020-04-19 DIAGNOSIS — U071 COVID-19: Secondary | ICD-10-CM | POA: Diagnosis not present

## 2020-04-19 DIAGNOSIS — J849 Interstitial pulmonary disease, unspecified: Secondary | ICD-10-CM

## 2020-04-19 DIAGNOSIS — R05 Cough: Secondary | ICD-10-CM | POA: Diagnosis not present

## 2020-04-19 DIAGNOSIS — J1282 Pneumonia due to coronavirus disease 2019: Secondary | ICD-10-CM

## 2020-04-19 DIAGNOSIS — R053 Chronic cough: Secondary | ICD-10-CM

## 2020-04-19 NOTE — Addendum Note (Signed)
Addended by: Gavin Potters R on: 04/19/2020 02:02 PM   Modules accepted: Orders

## 2020-04-19 NOTE — Patient Instructions (Addendum)
We will plan to perform baseline pulmonary function testing  We will plan to repeat your CT chest in 6 months to compare with July 2021.  Agree with going ahead and getting your COVID-19 vaccine Follow Dr. Lamonte Sakai in 6 months after your CT scan to review the results together.

## 2020-04-19 NOTE — Assessment & Plan Note (Signed)
Very subtle dependent groundglass noted in all lobes, improved compared with May but still visible.  We will plan to repeat her CT chest in 6 months to ensure stability (hopefully resolution).  Performed baseline pulmonary function testing to use in the event that her breathing changes, worsens.

## 2020-04-19 NOTE — Progress Notes (Signed)
Subjective:    Patient ID: Monique Wang, female    DOB: 02-02-1966, 54 y.o.   MRN: [id number]  HPI 54 year old never smoker with little past medical history. She is referred today for he COVID19, persistent sx and abnormal CXR  Started as flu-like sx, tested positive 4/12, had exposure to positive family members. Not admitted - was treated with steroids. Felt bad for over 2 weeks, treated again with steroids and abx. She has improved to large degree. Still has cough and some exertional SOB. Minimal GERD sx on PPI qd. Mild background allergies on allegra. She was given albuterol, an ICS. She has had some chest tightness that may have helped from albuterol.   High Res Ct chest 5/4 reviewed by me shows some scattered patchy groundglass infiltrates most notable dependently in the upper lobes, bilateral lower lobes.  Most prominent with some nodular foci in the right lower lobe.  Echocardiogram report from 01/31/2020 with normal LV and RV function, normal valves   ROV 04/19/20 -- This is a follow up visit post COVID-19 pneumonitis/pneumonia in April 2021.  She had persistent scattered patchy groundglass infiltrates on her CT 01/25/2020.  Continued to have some dyspnea and some cough. The cough is better, her functional capacity is improved also. She has had some nasal congestion due to allergies last 2-3 days.   CT chest 03/28/2020 reviewed by me, shows persistent mild patchy groundglass changes bilaterally without associated honeycombing, bronchiectasis or reticulation.  MDM: Reviewed Ct chest as above Reviewed FM OV from 03/07/20    Review of Systems As per HPI  Past Medical History:  Diagnosis Date   Bronchiectasis ([place])    Pneumonia      Family History  Problem Relation Age of Onset   Hypertension Mother    Hyperlipidemia Father    Stroke Father      Social History   Socioeconomic History   Marital status: Married    Spouse name: Not on file   Number of children: Not  on file   Years of education: Not on file   Highest education level: Not on file  Occupational History   Not on file  Tobacco Use   Smoking status: Never Smoker   Smokeless tobacco: Never Used  Substance and Sexual Activity   Alcohol use: No   Drug use: No   Sexual activity: Not on file  Other Topics Concern   Not on file  Social History Narrative   Not on file   Social Determinants of Health   Financial Resource Strain:    Difficulty of Paying Living Expenses:   Food Insecurity:    Worried About Charity fundraiser in the Last Year:    Arboriculturist in the Last Year:   Transportation Needs:    Film/video editor (Medical):    Lack of Transportation (Non-Medical):   Physical Activity:    Days of Exercise per Week:    Minutes of Exercise per Session:   Stress:    Feeling of Stress :   Social Connections:    Frequency of Communication with Friends and Family:    Frequency of Social Gatherings with Friends and Family:    Attends Religious Services:    Active Member of Clubs or Organizations:    Attends Archivist Meetings:    Marital Status:   Intimate Partner Violence:    Fear of Current or Ex-Partner:    Emotionally Abused:    Physically Abused:  Sexually Abused:      Allergies  Allergen Reactions   Honey Bee Venom Protein [Bee Venom] Anaphylaxis   Erythromycin Base Hives   Grass Pollen(K-O-R-T-Swt Vern) Cough, Other (See Comments) and Rash   Other Other (See Comments)   Ampicillin Rash   Erythromycin Rash     Outpatient Medications Prior to Visit  Medication Sig Dispense Refill   albuterol (VENTOLIN HFA) 108 (90 Base) MCG/ACT inhaler Inhale into the lungs every 6 (six) hours as needed for wheezing or shortness of breath.     benzonatate (TESSALON) 100 MG capsule Take 1 capsule (100 mg total) by mouth every 6 (six) hours as needed for cough. 30 capsule 1   cholecalciferol (VITAMIN D3) 25 MCG (1000 UNIT)  tablet Take 1,000 Units by mouth daily.     EPINEPHrine 0.3 mg/0.3 mL IJ SOAJ injection Inject 0.3 mg into the muscle as needed for anaphylaxis.     estradiol (ESTRACE) 1 MG tablet Take 1 mg by mouth daily.     fexofenadine (ALLEGRA) 180 MG tablet Take 180 mg by mouth daily.     fluticasone (FLONASE) 50 MCG/ACT nasal spray Place into both nostrils daily.     HYDROcodone-homatropine (HYCODAN) 5-1.5 MG/5ML syrup Take 5 mLs by mouth every 6 (six) hours as needed for cough. (Patient not taking: Reported on 03/07/2020) 240 mL 0   Multiple Vitamin (MULTIVITAMIN) tablet Take 1 tablet by mouth daily.     pantoprazole (PROTONIX) 40 MG tablet Take 40 mg by mouth daily.     progesterone (ENDOMETRIN) 100 MG vaginal insert Place 100 mg vaginally 2 (two) times daily.     No facility-administered medications prior to visit.        Objective:   Physical Exam Vitals:   04/19/20 1336  BP: (!) 118/64  Pulse: 104  Temp: 98.8 F (37.1 C)  TempSrc: Oral  SpO2: 99%  Weight: 175 lb 12.8 oz (79.7 kg)  Height: 5\' 3"  (1.6 m)   Gen: Pleasant, well-nourished, in no distress,  normal affect, no cough  ENT: No lesions,  mouth clear,  oropharynx clear, no postnasal drip  Neck: No JVD, no stridor  Lungs: No use of accessory muscles, no crackles or wheezing on normal respiration, no wheeze on forced expiration  Cardiovascular: RRR, heart sounds normal, no murmur or gallops, no peripheral edema  Musculoskeletal: No deformities, no cyanosis or clubbing  Neuro: alert, awake, non focal  Skin: Warm, no lesions or rash       Assessment & Plan:  ILD (interstitial lung disease) ([place]) Very subtle dependent groundglass noted in all lobes, improved compared with May but still visible.  We will plan to repeat her CT chest in 6 months to ensure stability (hopefully resolution).  Performed baseline pulmonary function testing to use in the event that her breathing changes, worsens.  Pneumonia due to  COVID-19 virus She is over 90 days out from her diagnosis and can now get the COVID-19 vaccine.  She wants to get the [employer] formulation.  I think that the benefits far outweigh any risks and I have encouraged her to do this.  Chronic cough Much improved.  She does have allergic disease and if the cough comes persistent, problematic than it would be worthwhile treating allergic rhinitis.  Baltazar Apo, MD, PhD 04/19/2020, 1:53 PM [facility] [phone] or if no answer [phone]

## 2020-04-19 NOTE — Assessment & Plan Note (Signed)
She is over 90 days out from her diagnosis and can now get the COVID-19 vaccine.  She wants to get the [employer] formulation.  I think that the benefits far outweigh any risks and I have encouraged her to do this.

## 2020-04-19 NOTE — Assessment & Plan Note (Signed)
Much improved.  She does have allergic disease and if the cough comes persistent, problematic than it would be worthwhile treating allergic rhinitis.

## 2020-04-22 DIAGNOSIS — R0981 Nasal congestion: Secondary | ICD-10-CM | POA: Diagnosis not present

## 2020-04-22 DIAGNOSIS — J019 Acute sinusitis, unspecified: Secondary | ICD-10-CM | POA: Diagnosis not present

## 2020-04-22 DIAGNOSIS — B9689 Other specified bacterial agents as the cause of diseases classified elsewhere: Secondary | ICD-10-CM | POA: Diagnosis not present

## 2020-05-02 DIAGNOSIS — Z23 Encounter for immunization: Secondary | ICD-10-CM | POA: Diagnosis not present

## 2020-05-06 ENCOUNTER — Other Ambulatory Visit (HOSPITAL_COMMUNITY)
Admission: RE | Admit: 2020-05-06 | Discharge: 2020-05-06 | Disposition: A | Discharge status: Home / Self Care | Payer: BC Managed Care – PPO | Source: Ambulatory Visit | Attending: Emergency Medicine | Admitting: Emergency Medicine

## 2020-05-06 DIAGNOSIS — Z20822 Contact with and (suspected) exposure to covid-19: Secondary | ICD-10-CM | POA: Diagnosis not present

## 2020-05-06 DIAGNOSIS — Z01812 Encounter for preprocedural laboratory examination: Secondary | ICD-10-CM | POA: Insufficient documentation

## 2020-05-06 LAB — SARS CORONAVIRUS 2 (TAT 6-24 HRS): SARS Coronavirus 2: NEGATIVE

## 2020-05-08 ENCOUNTER — Other Ambulatory Visit: Payer: Self-pay

## 2020-05-08 ENCOUNTER — Ambulatory Visit (AMBULATORY_SURGERY_CENTER): Payer: Self-pay

## 2020-05-08 ENCOUNTER — Encounter: Payer: Self-pay | Admitting: Gastroenterology

## 2020-05-08 VITALS — Ht 63.0 in | Wt 174.0 lb

## 2020-05-08 DIAGNOSIS — Z1211 Encounter for screening for malignant neoplasm of colon: Secondary | ICD-10-CM

## 2020-05-08 DIAGNOSIS — Z01818 Encounter for other preprocedural examination: Secondary | ICD-10-CM

## 2020-05-08 MED ORDER — NA SULFATE-K SULFATE-MG SULF 17.5-3.13-1.6 GM/177ML PO SOLN: 1.0000 | Freq: Once | ORAL | 0 refills | Status: AC

## 2020-05-08 NOTE — Progress Notes (Signed)
No egg or soy allergy known to patient  No issues with past sedation with any surgeries or procedures no intubation problems in the past  No FH of Malignant Hyperthermia No diet pills per patient No home 02 use per patient  No blood thinners per patient  Pt denies issues with constipation  No A fib or A flutter  EMMI video to pt or via MyChart  COVID 19 guidelines implemented in PV today with Pt and RN   Suprep Coupon given to pt in PV today , Code to Pharmacy   Due to the COVID-19 pandemic we are asking patients to follow these guidelines. Please only bring one care partner. Please be aware that your care partner may wait in the car in the parking lot or if they feel like they will be too hot to wait in the car, they may wait in the lobby on the 4th floor. All care partners are required to wear a mask the entire time (we do not have any that we can provide them), they need to practice social distancing, and we will do a Covid check for all patient's and care partners when you arrive. Also we will check their temperature and your temperature. If the care partner waits in their car they need to stay in the parking lot the entire time and we will call them on their cell phone when the patient is ready for discharge so they can bring the car to the front of the building. Also all patient's will need to wear a mask into building.  

## 2020-05-09 ENCOUNTER — Ambulatory Visit (INDEPENDENT_AMBULATORY_CARE_PROVIDER_SITE_OTHER): Payer: BC Managed Care – PPO | Admitting: Emergency Medicine

## 2020-05-09 DIAGNOSIS — J849 Interstitial pulmonary disease, unspecified: Secondary | ICD-10-CM

## 2020-05-09 LAB — PULMONARY FUNCTION TEST
DL/VA % pred: 111 %
DL/VA: 4.8 ml/min/mmHg/L
DLCO cor % pred: 109 %
DLCO cor: 22.17 ml/min/mmHg
DLCO unc % pred: 109 %
DLCO unc: 22.17 ml/min/mmHg
FEF 25-75 Post: 2.89 L/sec
FEF 25-75 Pre: 2.29 L/sec
FEF2575-%Change-Post: 26 %
FEF2575-%Pred-Post: 110 %
FEF2575-%Pred-Pre: 87 %
FEV1-%Change-Post: 7 %
FEV1-%Pred-Post: 102 %
FEV1-%Pred-Pre: 95 %
FEV1-Post: 2.73 L
FEV1-Pre: 2.55 L
FEV1FVC-%Change-Post: 8 %
FEV1FVC-%Pred-Pre: 98 %
FEV6-%Change-Post: 0 %
FEV6-%Pred-Post: 97 %
FEV6-%Pred-Pre: 98 %
FEV6-Post: 3.21 L
FEV6-Pre: 3.23 L
FEV6FVC-%Change-Post: 0 %
FEV6FVC-%Pred-Post: 102 %
FEV6FVC-%Pred-Pre: 102 %
FVC-%Change-Post: -1 %
FVC-%Pred-Post: 95 %
FVC-%Pred-Pre: 96 %
FVC-Post: 3.21 L
FVC-Pre: 3.25 L
Post FEV1/FVC ratio: 85 %
Post FEV6/FVC ratio: 100 %
Pre FEV1/FVC ratio: 78 %
Pre FEV6/FVC Ratio: 99 %
RV % pred: 105 %
RV: 1.87 L
TLC % pred: 102 %
TLC: 5.05 L

## 2020-05-09 NOTE — Progress Notes (Signed)
Full PFT performed today. °

## 2020-05-17 ENCOUNTER — Telehealth: Payer: Self-pay | Admitting: Emergency Medicine

## 2020-05-17 NOTE — Telephone Encounter (Signed)
Spoke with pt and reviewed PFT results. Pt stated understanding. Nothing further needed at this time.

## 2020-05-17 NOTE — Telephone Encounter (Signed)
Called and spoke to patient, who is requesting PFT results from 05/09/2020. Patient has pending recall for 09/2020.  Dr. Lamonte Sakai, please advise. Thanks

## 2020-05-17 NOTE — Telephone Encounter (Signed)
Please let her know that I reviewed her pulmonary function testing. Her airflows are grossly normal. There may have been some borderline improvement with inhalation of albuterol. Her lung volumes and effusion capacity are both normal. All of this is good news. We can review further when we see each other in office.

## 2020-05-18 ENCOUNTER — Ambulatory Visit (INDEPENDENT_AMBULATORY_CARE_PROVIDER_SITE_OTHER): Payer: BC Managed Care – PPO

## 2020-05-18 ENCOUNTER — Other Ambulatory Visit: Payer: Self-pay | Admitting: Gastroenterology

## 2020-05-18 DIAGNOSIS — Z1159 Encounter for screening for other viral diseases: Secondary | ICD-10-CM

## 2020-05-19 ENCOUNTER — Other Ambulatory Visit: Payer: Self-pay

## 2020-05-19 LAB — SARS CORONAVIRUS 2 (TAT 6-24 HRS): SARS Coronavirus 2: NEGATIVE

## 2020-05-22 ENCOUNTER — Other Ambulatory Visit: Payer: Self-pay

## 2020-05-22 ENCOUNTER — Encounter: Payer: Self-pay | Admitting: Gastroenterology

## 2020-05-22 ENCOUNTER — Ambulatory Visit (AMBULATORY_SURGERY_CENTER): Payer: BC Managed Care – PPO | Admitting: Gastroenterology

## 2020-05-22 VITALS — BP 113/72 | HR 57 | Temp 98.0°F | Resp 14 | Ht 63.0 in | Wt 174.0 lb

## 2020-05-22 DIAGNOSIS — D125 Benign neoplasm of sigmoid colon: Secondary | ICD-10-CM

## 2020-05-22 DIAGNOSIS — Z1211 Encounter for screening for malignant neoplasm of colon: Secondary | ICD-10-CM

## 2020-05-22 DIAGNOSIS — K6289 Other specified diseases of anus and rectum: Secondary | ICD-10-CM | POA: Diagnosis not present

## 2020-05-22 DIAGNOSIS — Z800 Family history of malignant neoplasm of digestive organs: Secondary | ICD-10-CM | POA: Diagnosis not present

## 2020-05-22 DIAGNOSIS — K635 Polyp of colon: Secondary | ICD-10-CM

## 2020-05-22 DIAGNOSIS — D129 Benign neoplasm of anus and anal canal: Secondary | ICD-10-CM | POA: Diagnosis not present

## 2020-05-22 MED ORDER — SODIUM CHLORIDE 0.9 % IV SOLN: 500.0000 mL | Freq: Once | INTRAVENOUS | Status: DC

## 2020-05-22 NOTE — Progress Notes (Signed)
VS by AG  Pt's states no medical or surgical changes since previsit or office visit.  

## 2020-05-22 NOTE — Op Note (Addendum)
[place] Patient Name: Monique Wang Procedure Date: 05/22/2020 8:11 AM MRN: [id number] Endoscopist: Remo Lipps P. Havery Moros , MD Age: 54 Referring MD:  Date of Birth: [birth year] Gender: Female Account #: [id number] Procedure:                Colonoscopy Indications:              Screening patient at increased risk: Family history                            of 1st-degree relative with colorectal cancer                            (father dx age 67s, grandmother dx age 30s), This                            is the patient's first colonoscopy Medicines:                Monitored Anesthesia Care Procedure:                Pre-Anesthesia Assessment:                           - Prior to the procedure, a History and Physical                            was performed, and patient medications and                            allergies were reviewed. The patient's tolerance of                            previous anesthesia was also reviewed. The risks                            and benefits of the procedure and the sedation                            options and risks were discussed with the patient.                            All questions were answered, and informed consent                            was obtained. Prior Anticoagulants: The patient has                            taken no previous anticoagulant or antiplatelet                            agents. ASA Grade Assessment: II - A patient with                            mild systemic disease. After reviewing the risks  and benefits, the patient was deemed in                            satisfactory condition to undergo the procedure.                           After obtaining informed consent, the colonoscope                            was passed under direct vision. Throughout the                            procedure, the patient's blood pressure, pulse, and                            oxygen saturations were  monitored continuously. The                            Colonoscope was introduced through the anus and                            advanced to the the cecum, identified by                            appendiceal orifice and ileocecal valve. The                            colonoscopy was performed without difficulty. The                            patient tolerated the procedure well. The quality                            of the bowel preparation was adequate. The                            ileocecal valve, appendiceal orifice, and rectum                            were photographed. Scope In: 8:24:56 AM Scope Out: 8:50:36 AM Scope Withdrawal Time: 0 hours 21 minutes 47 seconds  Total Procedure Duration: 0 hours 25 minutes 40 seconds  Findings:                 Hemorrhoids were found on perianal exam.                           A 4 mm polyp was found in the sigmoid colon. The                            polyp was sessile. The polyp was removed with a                            cold snare. Resection and retrieval were complete.  Anal papilla(e) was hypertrophied. Biopsies were                            taken with a cold forceps for histology to rule out                            AIN.                           Internal hemorrhoids were found during retroflexion.                           The exam was otherwise without abnormality. Prep                            was fair initially, took several minutes to lavage                            and clear the colon with adequate views obtained. Complications:            No immediate complications. Estimated blood loss:                            Minimal. Estimated Blood Loss:     Estimated blood loss was minimal. Impression:               - Hemorrhoids found on perianal exam.                           - One 4 mm polyp in the sigmoid colon, removed with                            a cold snare. Resected and retrieved.                            - Anal papillae was hypertrophied. Biopsied.                           - Internal hemorrhoids.                           - The examination was otherwise normal. Recommendation:           - Patient has a contact number available for                            emergencies. The signs and symptoms of potential                            delayed complications were discussed with the                            patient. Return to normal activities tomorrow.                            Written discharge instructions were provided  to the                            patient.                           - Resume previous diet.                           - Continue present medications.                           - Await pathology results. Remo Lipps P. Havery Moros, MD 05/22/2020 8:54:47 AM This report has been signed electronically.

## 2020-05-22 NOTE — Patient Instructions (Signed)
Await pathology results of polyp removed & of biopsies of anal papilla   Handout given to you on polyps and hemorrhoids    YOU HAD AN ENDOSCOPIC PROCEDURE TODAY AT [facility]:   Refer to the procedure report that was given to you for any specific questions about what was found during the examination.  If the procedure report does not answer your questions, please call your gastroenterologist to clarify.  If you requested that your care partner not be given the details of your procedure findings, then the procedure report has been included in a sealed envelope for you to review at your convenience later.  YOU SHOULD EXPECT: Some feelings of bloating in the abdomen. Passage of more gas than usual.  Walking can help get rid of the air that was put into your GI tract during the procedure and reduce the bloating. If you had a lower endoscopy (such as a colonoscopy or flexible sigmoidoscopy) you may notice spotting of blood in your stool or on the toilet paper. If you underwent a bowel prep for your procedure, you may not have a normal bowel movement for a few days.  Please Note:  You might notice some irritation and congestion in your nose or some drainage.  This is from the oxygen used during your procedure.  There is no need for concern and it should clear up in a day or so.  SYMPTOMS TO REPORT IMMEDIATELY:   Following lower endoscopy (colonoscopy or flexible sigmoidoscopy):  Excessive amounts of blood in the stool  Significant tenderness or worsening of abdominal pains  Swelling of the abdomen that is new, acute  Fever of 100F or higher   Following upper endoscopy (EGD)  Vomiting of blood or coffee ground material  New chest pain or pain under the shoulder blades  Painful or persistently difficult swallowing  New shortness of breath  Fever of 100F or higher  Black, tarry-looking stools  For urgent or emergent issues, a gastroenterologist can be reached at any hour  by calling [phone]. Do not use MyChart messaging for urgent concerns.    DIET:  We do recommend a small meal at first, but then you may proceed to your regular diet.  Drink plenty of fluids but you should avoid alcoholic beverages for 24 hours.  ACTIVITY:  You should plan to take it easy for the rest of today and you should NOT DRIVE or use heavy machinery until tomorrow (because of the sedation medicines used during the test).    FOLLOW UP: Our staff will call the number listed on your records 48-72 hours following your procedure to check on you and address any questions or concerns that you may have regarding the information given to you following your procedure. If we do not reach you, we will leave a message.  We will attempt to reach you two times.  During this call, we will ask if you have developed any symptoms of COVID 19. If you develop any symptoms (ie: fever, flu-like symptoms, shortness of breath, cough etc.) before then, please call [phone].  If you test positive for Covid 19 in the 2 weeks post procedure, please call and report this information to [place].    If any biopsies were taken you will be contacted by phone or by letter within the next 1-3 weeks.  Please call us at [phone] if you have not heard about the biopsies in 3 weeks.    SIGNATURES/CONFIDENTIALITY: You and/or your care  partner have signed paperwork which will be entered into your electronic medical record.  These signatures attest to the fact that that the information above on your After Visit Summary has been reviewed and is understood.  Full responsibility of the confidentiality of this discharge information lies with you and/or your care-partner.

## 2020-05-22 NOTE — Progress Notes (Signed)
To PACU, VSS. Report to Rn.tb 

## 2020-05-22 NOTE — Progress Notes (Signed)
Called to room to assist during endoscopic procedure.  Patient ID and intended procedure confirmed with present staff. Received instructions for my participation in the procedure from the performing physician.  

## 2020-05-24 ENCOUNTER — Telehealth: Payer: Self-pay

## 2020-05-24 NOTE — Telephone Encounter (Signed)
°  Follow up Call-  Call back number 05/22/2020  Post procedure Call Back phone  # [phone]  Permission to leave phone message Yes  Some recent data might be hidden     Patient questions:  Do you have a fever, pain , or abdominal swelling? No. Pain Score  0 *  Have you tolerated food without any problems? Yes.    Have you been able to return to your normal activities? Yes.    Do you have any questions about your discharge instructions: Diet   No. Medications  No. Follow up visit  No.  Do you have questions or concerns about your Care? No.  Actions: * If pain score is 4 or above: No action needed, pain <4.  Follow up Call-  Call back number 05/22/2020  Post procedure Call Back phone  # [phone]  Permission to leave phone message Yes  Some recent data might be hidden     Patient questions:    1. If yes to any of these questions please route to Joylene John, RN and Joella Prince, RN  2.   Have you had an respiratory symptoms (SOB or cough) since your procedure? no  3.   Have you tested positive for COVID 19 since your procedure no  4.   Have you had any family members/close contacts diagnosed with the COVID 19 since your procedure?  no    Actions:

## 2020-06-05 DIAGNOSIS — Z23 Encounter for immunization: Secondary | ICD-10-CM | POA: Diagnosis not present

## 2020-06-09 DIAGNOSIS — Z23 Encounter for immunization: Secondary | ICD-10-CM | POA: Diagnosis not present

## 2020-06-23 ENCOUNTER — Other Ambulatory Visit: Payer: Self-pay

## 2020-06-23 ENCOUNTER — Encounter: Payer: Self-pay | Admitting: Internal Medicine

## 2020-06-23 ENCOUNTER — Ambulatory Visit (INDEPENDENT_AMBULATORY_CARE_PROVIDER_SITE_OTHER): Payer: BC Managed Care – PPO | Admitting: Internal Medicine

## 2020-06-23 VITALS — BP 110/80 | HR 73 | Temp 97.8°F | Ht 63.0 in | Wt 173.8 lb

## 2020-06-23 DIAGNOSIS — K219 Gastro-esophageal reflux disease without esophagitis: Secondary | ICD-10-CM

## 2020-06-23 DIAGNOSIS — Z131 Encounter for screening for diabetes mellitus: Secondary | ICD-10-CM | POA: Diagnosis not present

## 2020-06-23 DIAGNOSIS — D510 Vitamin B12 deficiency anemia due to intrinsic factor deficiency: Secondary | ICD-10-CM | POA: Diagnosis not present

## 2020-06-23 DIAGNOSIS — Z0000 Encounter for general adult medical examination without abnormal findings: Secondary | ICD-10-CM

## 2020-06-23 DIAGNOSIS — Z13220 Encounter for screening for lipoid disorders: Secondary | ICD-10-CM | POA: Diagnosis not present

## 2020-06-23 DIAGNOSIS — Z23 Encounter for immunization: Secondary | ICD-10-CM

## 2020-06-23 DIAGNOSIS — J849 Interstitial pulmonary disease, unspecified: Secondary | ICD-10-CM | POA: Diagnosis not present

## 2020-06-23 DIAGNOSIS — E559 Vitamin D deficiency, unspecified: Secondary | ICD-10-CM | POA: Diagnosis not present

## 2020-06-23 MED ORDER — PANTOPRAZOLE SODIUM 40 MG PO TBEC: 40.0000 mg | DELAYED_RELEASE_TABLET | Freq: Every day | ORAL | 1 refills | Status: DC

## 2020-06-23 MED ORDER — ALBUTEROL SULFATE HFA 108 (90 BASE) MCG/ACT IN AERS: 2.0000 | INHALATION_SPRAY | Freq: Four times a day (QID) | RESPIRATORY_TRACT | 3 refills | Status: DC | PRN

## 2020-06-23 NOTE — Addendum Note (Signed)
Addended by: Marrion Coy on: 06/23/2020 07:50 AM   Modules accepted: Orders

## 2020-06-23 NOTE — Addendum Note (Signed)
Addended by: Westley Hummer B on: 06/23/2020 10:49 AM   Modules accepted: Orders

## 2020-06-23 NOTE — Progress Notes (Signed)
Established Patient Office Visit     This visit occurred during the SARS-CoV-2 public health emergency.  Safety protocols were in place, including screening questions prior to the visit, additional usage of staff PPE, and extensive cleaning of exam room while observing appropriate contact time as indicated for disinfecting solutions.    CC/Reason for Visit: Annual preventive exam  HPI: Monique Wang is a 54 y.o. female who is coming in today for the above mentioned reasons. Past Medical History is significant for: Mild interstitial lung disease as a sequela of COVID-19 pneumonia earlier this year.  She is otherwise doing well and has no complaints.  She follows with pulmonary, there are plans to repeat PFTs and CT scan soon.  She has routine eye and dental care.  Her exercise tolerance has been limited due to shortness of breath which is presumably related to her interstitial lung disease.  She had a negative mammogram in July, a colonoscopy in August.  She has now completed her 2 Covid vaccines and had her flu vaccine on September 28.   Past Medical/Surgical History: Past Medical History:  Diagnosis Date  . Allergy    environmental  . Bronchiectasis ([place])   . COVID-19 01/2020  . GERD (gastroesophageal reflux disease)   . Pneumonia     Past Surgical History:  Procedure Laterality Date  . CARPAL TUNNEL RELEASE Right    ganglion cyst removal  . CESAREAN SECTION    . HERNIA REPAIR    . SHOULDER SURGERY    . uterine ablation      Social History:  reports that she has never smoked. She has never used smokeless tobacco. She reports that she does not drink alcohol and does not use drugs.  Allergies: Allergies  Allergen Reactions  . Honey Bee Venom Protein [Bee Venom] Anaphylaxis  . Grass Pollen(K-O-R-T-Swt Vern) Cough, Other (See Comments) and Rash  . Erythromycin Rash    Family History:  Family History  Problem Relation Age of Onset  . Hypertension Mother   .  Hyperlipidemia Father   . Stroke Father   . Colon polyps Father   . Colon cancer Father   . Colon cancer Paternal Grandmother   . Esophageal cancer Neg Hx   . Stomach cancer Neg Hx   . Rectal cancer Neg Hx      Current Outpatient Medications:  .  albuterol (VENTOLIN HFA) 108 (90 Base) MCG/ACT inhaler, Inhale 2 puffs into the lungs every 6 (six) hours as needed for wheezing or shortness of breath., Disp: 1 each, Rfl: 3 .  D 2000 50 MCG (2000 UT) TABS, Take 1 tablet by mouth daily., Disp: , Rfl:  .  EPINEPHrine 0.3 mg/0.3 mL IJ SOAJ injection, Inject 0.3 mg into the muscle as needed for anaphylaxis., Disp: , Rfl:  .  estradiol (ESTRACE) 1 MG tablet, Take 1 mg by mouth daily., Disp: , Rfl:  .  fexofenadine (ALLEGRA) 180 MG tablet, Take 180 mg by mouth daily., Disp: , Rfl:  .  fluticasone (FLONASE) 50 MCG/ACT nasal spray, Place into both nostrils daily., Disp: , Rfl:  .  Multiple Vitamin (MULTIVITAMIN) tablet, Take 1 tablet by mouth daily., Disp: , Rfl:  .  pantoprazole (PROTONIX) 40 MG tablet, Take 1 tablet (40 mg total) by mouth daily., Disp: 90 tablet, Rfl: 1 .  progesterone (PROMETRIUM) 100 MG capsule, progesterone micronized 100 mg capsule, Disp: , Rfl:   Review of Systems:  Constitutional: Denies fever, chills, diaphoresis, appetite change and  fatigue.  HEENT: Denies photophobia, eye pain, redness, hearing loss, ear pain, congestion, sore throat, rhinorrhea, sneezing, mouth sores, trouble swallowing, neck pain, neck stiffness and tinnitus.   Respiratory: Denies SOB, DOE, cough, chest tightness,  and wheezing.   Cardiovascular: Denies chest pain, palpitations and leg swelling.  Gastrointestinal: Denies nausea, vomiting, abdominal pain, diarrhea, constipation, blood in stool and abdominal distention.  Genitourinary: Denies dysuria, urgency, frequency, hematuria, flank pain and difficulty urinating.  Endocrine: Denies: hot or cold intolerance, sweats, changes in hair or nails, polyuria,  polydipsia. Musculoskeletal: Denies myalgias, back pain, joint swelling, arthralgias and gait problem.  Skin: Denies pallor, rash and wound.  Neurological: Denies dizziness, seizures, syncope, weakness, light-headedness, numbness and headaches.  Hematological: Denies adenopathy. Easy bruising, personal or family bleeding history  Psychiatric/Behavioral: Denies suicidal ideation, mood changes, confusion, nervousness, sleep disturbance and agitation    Physical Exam: Vitals:   06/23/20 0707  BP: 110/80  Pulse: 73  Temp: 97.8 F (36.6 C)  TempSrc: Oral  SpO2: 98%  Weight: 173 lb 12.8 oz (78.8 kg)  Height: $Remove'5\' 3"'[id number]$  (1.6 m)    Body mass index is 30.79 kg/m.   Constitutional: NAD, calm, comfortable Eyes: PERRL, lids and conjunctivae normal ENMT: Mucous membranes are moist. Tympanic membrane is pearly white, no erythema or bulging. Neck: normal, supple, no masses, no thyromegaly Respiratory: clear to auscultation bilaterally, no wheezing, no crackles. Normal respiratory effort. No accessory muscle use.  Cardiovascular: Regular rate and rhythm, no murmurs / rubs / gallops. No extremity edema. 2+ pedal pulses.  Abdomen: no tenderness, no masses palpated. No hepatosplenomegaly. Bowel sounds positive.  Musculoskeletal: no clubbing / cyanosis. No joint deformity upper and lower extremities. Good ROM, no contractures. Normal muscle tone.  Skin: no rashes, lesions, ulcers. No induration Neurologic: CN 2-12 grossly intact. Sensation intact, DTR normal. Strength 5/5 in all 4.  Psychiatric: Normal judgment and insight. Alert and oriented x 3. Normal mood.    Impression and Plan:  Encounter for preventive health examination -She has routine eye and dental care. -For for shingles vaccine today, otherwise immunizations are up-to-date. -Screening labs today. -Healthy lifestyle discussed in detail. -She had a Pap smear in 2020 and follows with GYN. -Negative mammogram in July 2021. -She had a  colonoscopy in August 2021 and is a 10-year callback.  ILD (interstitial lung disease) ([place]) -She is requesting refills of her albuterol that she uses every 6 hours as needed. -Follow-up with pulmonary as scheduled.  Gastroesophageal reflux disease without esophagitis -Well-controlled on daily Protonix 40 mg daily, refill today.  Need for shingles vaccine -Shingles vaccine administered today.    Patient Instructions  -Nice seeing you today!!  -Lab work today; will notify you once results are available.  -First shingles vaccine today.  -Schedule follow up with me in 1 year or sooner as needed.   Preventive Care 6-95 Years Old, Female Preventive care refers to visits with your health care provider and lifestyle choices that can promote health and wellness. This includes:  A yearly physical exam. This may also be called an annual well check.  Regular dental visits and eye exams.  Immunizations.  Screening for certain conditions.  Healthy lifestyle choices, such as eating a healthy diet, getting regular exercise, not using drugs or products that contain nicotine and tobacco, and limiting alcohol use. What can I expect for my preventive care visit? Physical exam Your health care provider will check your:  Height and weight. This may be used to calculate body mass index (BMI),  which tells if you are at a healthy weight.  Heart rate and blood pressure.  Skin for abnormal spots. Counseling Your health care provider may ask you questions about your:  Alcohol, tobacco, and drug use.  Emotional well-being.  Home and relationship well-being.  Sexual activity.  Eating habits.  Work and work Statistician.  Method of birth control.  Menstrual cycle.  Pregnancy history. What immunizations do I need?  Influenza (flu) vaccine  This is recommended every year. Tetanus, diphtheria, and pertussis (Tdap) vaccine  You may need a Td booster every 10 years. Varicella  (chickenpox) vaccine  You may need this if you have not been vaccinated. Zoster (shingles) vaccine  You may need this after age 26. Measles, mumps, and rubella (MMR) vaccine  You may need at least one dose of MMR if you were born in 1957 or later. You may also need a second dose. Pneumococcal conjugate (PCV13) vaccine  You may need this if you have certain conditions and were not previously vaccinated. Pneumococcal polysaccharide (PPSV23) vaccine  You may need one or two doses if you smoke cigarettes or if you have certain conditions. Meningococcal conjugate (MenACWY) vaccine  You may need this if you have certain conditions. Hepatitis A vaccine  You may need this if you have certain conditions or if you travel or work in places where you may be exposed to hepatitis A. Hepatitis B vaccine  You may need this if you have certain conditions or if you travel or work in places where you may be exposed to hepatitis B. Haemophilus influenzae type b (Hib) vaccine  You may need this if you have certain conditions. Human papillomavirus (HPV) vaccine  If recommended by your health care provider, you may need three doses over 6 months. You may receive vaccines as individual doses or as more than one vaccine together in one shot (combination vaccines). Talk with your health care provider about the risks and benefits of combination vaccines. What tests do I need? Blood tests  Lipid and cholesterol levels. These may be checked every 5 years, or more frequently if you are over 41 years old.  Hepatitis C test.  Hepatitis B test. Screening  Lung cancer screening. You may have this screening every year starting at age 21 if you have a 30-pack-year history of smoking and currently smoke or have quit within the past 15 years.  Colorectal cancer screening. All adults should have this screening starting at age 52 and continuing until age 41. Your health care provider may recommend screening at  age 63 if you are at increased risk. You will have tests every 1-10 years, depending on your results and the type of screening test.  Diabetes screening. This is done by checking your blood sugar (glucose) after you have not eaten for a while (fasting). You may have this done every 1-3 years.  Mammogram. This may be done every 1-2 years. Talk with your health care provider about when you should start having regular mammograms. This may depend on whether you have a family history of breast cancer.  BRCA-related cancer screening. This may be done if you have a family history of breast, ovarian, tubal, or peritoneal cancers.  Pelvic exam and Pap test. This may be done every 3 years starting at age 25. Starting at age 52, this may be done every 5 years if you have a Pap test in combination with an HPV test. Other tests  Sexually transmitted disease (STD) testing.  Bone density scan.  This is done to screen for osteoporosis. You may have this scan if you are at high risk for osteoporosis. Follow these instructions at home: Eating and drinking  Eat a diet that includes fresh fruits and vegetables, whole grains, lean protein, and low-fat dairy.  Take vitamin and mineral supplements as recommended by your health care provider.  Do not drink alcohol if: ? Your health care provider tells you not to drink. ? You are pregnant, may be pregnant, or are planning to become pregnant.  If you drink alcohol: ? Limit how much you have to 0-1 drink a day. ? Be aware of how much alcohol is in your drink. In the [place] one drink equals one 12 oz bottle of beer (355 mL), one 5 oz glass of wine (148 mL), or one 1 oz glass of hard liquor (44 mL). Lifestyle  Take daily care of your teeth and gums.  Stay active. Exercise for at least 30 minutes on 5 or more days each week.  Do not use any products that contain nicotine or tobacco, such as cigarettes, e-cigarettes, and chewing tobacco. If you need help quitting,  ask your health care provider.  If you are sexually active, practice safe sex. Use a condom or other form of birth control (contraception) in order to prevent pregnancy and STIs (sexually transmitted infections).  If told by your health care provider, take low-dose aspirin daily starting at age 4. What's next?  Visit your health care provider once a year for a well check visit.  Ask your health care provider how often you should have your eyes and teeth checked.  Stay up to date on all vaccines. This information is not intended to replace advice given to you by your health care provider. Make sure you discuss any questions you have with your health care provider. Document Revised: 05/21/2018 Document Reviewed: 05/21/2018 Elsevier Patient Education  2020 [place], [place] [facility]

## 2020-06-23 NOTE — Patient Instructions (Signed)
-Nice seeing you today!!  -Lab work today; will notify you once results are available.  -First shingles vaccine today.  -Schedule follow up with me in 1 year or sooner as needed.   Preventive Care 36-52 Years Old, Female Preventive care refers to visits with your health care provider and lifestyle choices that can promote health and wellness. This includes:  A yearly physical exam. This may also be called an annual well check.  Regular dental visits and eye exams.  Immunizations.  Screening for certain conditions.  Healthy lifestyle choices, such as eating a healthy diet, getting regular exercise, not using drugs or products that contain nicotine and tobacco, and limiting alcohol use. What can I expect for my preventive care visit? Physical exam Your health care provider will check your:  Height and weight. This may be used to calculate body mass index (BMI), which tells if you are at a healthy weight.  Heart rate and blood pressure.  Skin for abnormal spots. Counseling Your health care provider may ask you questions about your:  Alcohol, tobacco, and drug use.  Emotional well-being.  Home and relationship well-being.  Sexual activity.  Eating habits.  Work and work Statistician.  Method of birth control.  Menstrual cycle.  Pregnancy history. What immunizations do I need?  Influenza (flu) vaccine  This is recommended every year. Tetanus, diphtheria, and pertussis (Tdap) vaccine  You may need a Td booster every 10 years. Varicella (chickenpox) vaccine  You may need this if you have not been vaccinated. Zoster (shingles) vaccine  You may need this after age 71. Measles, mumps, and rubella (MMR) vaccine  You may need at least one dose of MMR if you were born in 1957 or later. You may also need a second dose. Pneumococcal conjugate (PCV13) vaccine  You may need this if you have certain conditions and were not previously vaccinated. Pneumococcal  polysaccharide (PPSV23) vaccine  You may need one or two doses if you smoke cigarettes or if you have certain conditions. Meningococcal conjugate (MenACWY) vaccine  You may need this if you have certain conditions. Hepatitis A vaccine  You may need this if you have certain conditions or if you travel or work in places where you may be exposed to hepatitis A. Hepatitis B vaccine  You may need this if you have certain conditions or if you travel or work in places where you may be exposed to hepatitis B. Haemophilus influenzae type b (Hib) vaccine  You may need this if you have certain conditions. Human papillomavirus (HPV) vaccine  If recommended by your health care provider, you may need three doses over 6 months. You may receive vaccines as individual doses or as more than one vaccine together in one shot (combination vaccines). Talk with your health care provider about the risks and benefits of combination vaccines. What tests do I need? Blood tests  Lipid and cholesterol levels. These may be checked every 5 years, or more frequently if you are over 81 years old.  Hepatitis C test.  Hepatitis B test. Screening  Lung cancer screening. You may have this screening every year starting at age 31 if you have a 30-pack-year history of smoking and currently smoke or have quit within the past 15 years.  Colorectal cancer screening. All adults should have this screening starting at age 27 and continuing until age 55. Your health care provider may recommend screening at age 38 if you are at increased risk. You will have tests every 1-10 years,  depending on your results and the type of screening test.  Diabetes screening. This is done by checking your blood sugar (glucose) after you have not eaten for a while (fasting). You may have this done every 1-3 years.  Mammogram. This may be done every 1-2 years. Talk with your health care provider about when you should start having regular  mammograms. This may depend on whether you have a family history of breast cancer.  BRCA-related cancer screening. This may be done if you have a family history of breast, ovarian, tubal, or peritoneal cancers.  Pelvic exam and Pap test. This may be done every 3 years starting at age 13. Starting at age 62, this may be done every 5 years if you have a Pap test in combination with an HPV test. Other tests  Sexually transmitted disease (STD) testing.  Bone density scan. This is done to screen for osteoporosis. You may have this scan if you are at high risk for osteoporosis. Follow these instructions at home: Eating and drinking  Eat a diet that includes fresh fruits and vegetables, whole grains, lean protein, and low-fat dairy.  Take vitamin and mineral supplements as recommended by your health care provider.  Do not drink alcohol if: ? Your health care provider tells you not to drink. ? You are pregnant, may be pregnant, or are planning to become pregnant.  If you drink alcohol: ? Limit how much you have to 0-1 drink a day. ? Be aware of how much alcohol is in your drink. In the [place] one drink equals one 12 oz bottle of beer (355 mL), one 5 oz glass of wine (148 mL), or one 1 oz glass of hard liquor (44 mL). Lifestyle  Take daily care of your teeth and gums.  Stay active. Exercise for at least 30 minutes on 5 or more days each week.  Do not use any products that contain nicotine or tobacco, such as cigarettes, e-cigarettes, and chewing tobacco. If you need help quitting, ask your health care provider.  If you are sexually active, practice safe sex. Use a condom or other form of birth control (contraception) in order to prevent pregnancy and STIs (sexually transmitted infections).  If told by your health care provider, take low-dose aspirin daily starting at age 33. What's next?  Visit your health care provider once a year for a well check visit.  Ask your health care provider  how often you should have your eyes and teeth checked.  Stay up to date on all vaccines. This information is not intended to replace advice given to you by your health care provider. Make sure you discuss any questions you have with your health care provider. Document Revised: 05/21/2018 Document Reviewed: 05/21/2018 Elsevier Patient Education  2020 [employer].

## 2020-06-24 LAB — COMPREHENSIVE METABOLIC PANEL
AG Ratio: 1.6 (calc) (ref 1.0–2.5)
ALT: 15 U/L (ref 6–29)
AST: 17 U/L (ref 10–35)
Albumin: 3.9 g/dL (ref 3.6–5.1)
Alkaline phosphatase (APISO): 94 U/L (ref 37–153)
BUN: 15 mg/dL (ref 7–25)
CO2: 28 mmol/L (ref 20–32)
Calcium: 9.4 mg/dL (ref 8.6–10.4)
Chloride: 104 mmol/L (ref 98–110)
Creat: 0.71 mg/dL (ref 0.50–1.05)
Globulin: 2.4 g/dL (calc) (ref 1.9–3.7)
Glucose, Bld: 94 mg/dL (ref 65–99)
Potassium: 4.2 mmol/L (ref 3.5–5.3)
Sodium: 139 mmol/L (ref 135–146)
Total Bilirubin: 0.5 mg/dL (ref 0.2–1.2)
Total Protein: 6.3 g/dL (ref 6.1–8.1)

## 2020-06-24 LAB — CBC WITH DIFFERENTIAL/PLATELET
Absolute Monocytes: 423 cells/uL (ref 200–950)
Basophils Absolute: 42 cells/uL (ref 0–200)
Basophils Relative: 0.9 %
Eosinophils Absolute: 273 cells/uL (ref 15–500)
Eosinophils Relative: 5.8 %
HCT: 41.6 % (ref 35.0–45.0)
Hemoglobin: 13.3 g/dL (ref 11.7–15.5)
Lymphs Abs: 1043 cells/uL (ref 850–3900)
MCH: 31 pg (ref 27.0–33.0)
MCHC: 32 g/dL (ref 32.0–36.0)
MCV: 97 fL (ref 80.0–100.0)
MPV: 10.2 fL (ref 7.5–12.5)
Monocytes Relative: 9 %
Neutro Abs: 2919 cells/uL (ref 1500–7800)
Neutrophils Relative %: 62.1 %
Platelets: 292 10*3/uL (ref 140–400)
RBC: 4.29 10*6/uL (ref 3.80–5.10)
RDW: 11.8 % (ref 11.0–15.0)
Total Lymphocyte: 22.2 %
WBC: 4.7 10*3/uL (ref 3.8–10.8)

## 2020-06-24 LAB — HEMOGLOBIN A1C
Hgb A1c MFr Bld: 5.2 % of total Hgb (ref <5.7)
Mean Plasma Glucose: 103 (calc)
eAG (mmol/L): 5.7 (calc)

## 2020-06-24 LAB — LIPID PANEL
Cholesterol: 140 mg/dL (ref <200)
HDL: 72 mg/dL (ref >=50)
LDL Cholesterol (Calc): 47 mg/dL (calc)
Non-HDL Cholesterol (Calc): 68 mg/dL (calc) (ref <130)
Total CHOL/HDL Ratio: 1.9 (calc) (ref <5.0)
Triglycerides: 126 mg/dL (ref <150)

## 2020-06-24 LAB — VITAMIN B12: Vitamin B-12: 947 pg/mL (ref 200–1100)

## 2020-06-24 LAB — VITAMIN D 25 HYDROXY (VIT D DEFICIENCY, FRACTURES): Vit D, 25-Hydroxy: 54 ng/mL (ref 30–100)

## 2020-06-24 LAB — TSH: TSH: 0.85 mIU/L

## 2020-07-12 ENCOUNTER — Telehealth: Payer: Self-pay | Admitting: Emergency Medicine

## 2020-07-12 NOTE — Telephone Encounter (Signed)
Dr Lamonte Sakai- pt asking if she can go ahead and get her CT Chest in Dec 2021 rather than Jan 2022 for insurance purposes. Her last CT was 03/28/20- impression:  IMPRESSION: Mild patchy ground-glass opacity throughout both lungs. 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 Suggest follow-up high-resolution chest CT study in 6-12 months.  Please advise, thanks!

## 2020-07-12 NOTE — Telephone Encounter (Signed)
Pt calling in stating that she got a mychart message appointment with Dr. Melvyn Novas -she doesn't see Dr. Melvyn Novas - she is a [place] pt - also checking into the CT in December status - please advise pt - [phone]

## 2020-07-12 NOTE — Telephone Encounter (Signed)
Patient is due to have ct in January I have called to let her know but she is asking if she can have it in Dec due to meeting her deductible

## 2020-07-13 NOTE — Telephone Encounter (Signed)
Ct should be fine to schedule I will print it and schedule

## 2020-07-13 NOTE — Telephone Encounter (Signed)
lmtcb for pt.  

## 2020-07-13 NOTE — Telephone Encounter (Signed)
Yes Ok to get in Dec >> try to get it later in the month She should follow with me after

## 2020-07-13 NOTE — Telephone Encounter (Signed)
Spoke with pt, aware of recs.  Cancelled appt with MW in December as she does not follow with MW.    PCCs please advise if current CT order can be used for December CT, or if we need to place a new order.  Thanks!   Also please note we need to schedule an OV with RB in December after CT is ordered, per RB's recs.  Thanks!

## 2020-08-23 ENCOUNTER — Ambulatory Visit (INDEPENDENT_AMBULATORY_CARE_PROVIDER_SITE_OTHER): Payer: BC Managed Care – PPO | Admitting: *Deleted

## 2020-08-23 ENCOUNTER — Other Ambulatory Visit: Payer: Self-pay

## 2020-08-23 DIAGNOSIS — Z23 Encounter for immunization: Secondary | ICD-10-CM

## 2020-08-28 ENCOUNTER — Ambulatory Visit: Payer: BC Managed Care – PPO | Admitting: Internal Medicine

## 2020-09-05 ENCOUNTER — Ambulatory Visit
Admission: RE | Admit: 2020-09-05 | Discharge: 2020-09-05 | Disposition: A | Discharge status: Home / Self Care | Payer: BC Managed Care – PPO | Source: Ambulatory Visit | Attending: Emergency Medicine | Admitting: Emergency Medicine

## 2020-09-05 ENCOUNTER — Ambulatory Visit (INDEPENDENT_AMBULATORY_CARE_PROVIDER_SITE_OTHER): Payer: BC Managed Care – PPO | Admitting: Emergency Medicine

## 2020-09-05 ENCOUNTER — Encounter: Payer: Self-pay | Admitting: Emergency Medicine

## 2020-09-05 ENCOUNTER — Other Ambulatory Visit: Payer: Self-pay

## 2020-09-05 DIAGNOSIS — U071 COVID-19: Secondary | ICD-10-CM

## 2020-09-05 DIAGNOSIS — J849 Interstitial pulmonary disease, unspecified: Secondary | ICD-10-CM | POA: Diagnosis not present

## 2020-09-05 DIAGNOSIS — R918 Other nonspecific abnormal finding of lung field: Secondary | ICD-10-CM | POA: Diagnosis not present

## 2020-09-05 DIAGNOSIS — J1282 Pneumonia due to coronavirus disease 2019: Secondary | ICD-10-CM | POA: Diagnosis not present

## 2020-09-05 NOTE — Progress Notes (Signed)
Subjective:    Patient ID: Monique Wang, female    DOB: [birth year], 54 y.o.   MRN: [id number]  HPI 54 year old never smoker with little past medical history. She is referred today for he COVID19, persistent sx and abnormal CXR  Started as flu-like sx, tested positive 4/12, had exposure to positive family members. Not admitted - was treated with steroids. Felt bad for over 2 weeks, treated again with steroids and abx. She has improved to large degree. Still has cough and some exertional SOB. Minimal GERD sx on PPI qd. Mild background allergies on allegra. She was given albuterol, an ICS. She has had some chest tightness that may have helped from albuterol.   High Res Ct chest 5/4 reviewed by me shows some scattered patchy groundglass infiltrates most notable dependently in the upper lobes, bilateral lower lobes.  Most prominent with some nodular foci in the right lower lobe.  Echocardiogram report from 01/31/2020 with normal LV and RV function, normal valves   ROV 04/19/20 -- This is a follow up visit post COVID-19 pneumonitis/pneumonia in April 2021.  She had persistent scattered patchy groundglass infiltrates on her CT 01/25/2020.  Continued to have some dyspnea and some cough. The cough is better, her functional capacity is improved also. She has had some nasal congestion due to allergies last 2-3 days.   CT chest 03/28/2020 reviewed by me, shows persistent mild patchy groundglass changes bilaterally without associated honeycombing, bronchiectasis or reticulation.  ROV 09/05/20 --54 year old never smoker who I have followed in the aftermath of COVID-19 pneumonia.  She had residual cough, dyspnea and patchy infiltrates on chest imaging.  Pulmonary function testing done on 05/09/2020 reviewed by me, shows normal airflows without a bronchodilator response, normal lung volumes and diffusion capacity. She had a repeat high-resolution CT scan done today 09/05/2020 which I have reviewed, shows  large-scale resolution of her previous groundglass abnormalities.  There is a single persistent 4 mm groundglass opacity in the right apex, numerous fine centrilobular pulmonary nodules consistent with possible bronchiolitis (never smoker)  MDM: Reviewed Ct chest as above Reviewed FM OV from 03/07/20    Review of Systems As per HPI  Past Medical History:  Diagnosis Date  . Allergy    environmental  . Bronchiectasis ([place])   . COVID-19 01/2020  . GERD (gastroesophageal reflux disease)   . Pneumonia      Family History  Problem Relation Age of Onset  . Hypertension Mother   . Hyperlipidemia Father   . Stroke Father   . Colon polyps Father   . Colon cancer Father   . Colon cancer Paternal Grandmother   . Esophageal cancer Neg Hx   . Stomach cancer Neg Hx   . Rectal cancer Neg Hx      Social History   Socioeconomic History  . Marital status: Married    Spouse name: Not on file  . Number of children: Not on file  . Years of education: Not on file  . Highest education level: Not on file  Occupational History  . Not on file  Tobacco Use  . Smoking status: Never Smoker  . Smokeless tobacco: Never Used  Vaping Use  . Vaping Use: Never used  Substance and Sexual Activity  . Alcohol use: No  . Drug use: No  . Sexual activity: Not on file  Other Topics Concern  . Not on file  Social History Narrative  . Not on file   Social Determinants of Health   Financial  Resource Strain: Not on file  Food Insecurity: Not on file  Transportation Needs: Not on file  Physical Activity: Not on file  Stress: Not on file  Social Connections: Not on file  Intimate Partner Violence: Not on file     Allergies  Allergen Reactions  . Honey Bee Venom Protein [Bee Venom] Anaphylaxis  . Grass Pollen(K-O-R-T-Swt Vern) Cough, Other (See Comments) and Rash  . Erythromycin Rash     Outpatient Medications Prior to Visit  Medication Sig Dispense Refill  . albuterol (VENTOLIN HFA) 108  (90 Base) MCG/ACT inhaler Inhale 2 puffs into the lungs every 6 (six) hours as needed for wheezing or shortness of breath. 1 each 3  . D 2000 50 MCG (2000 UT) TABS Take 1 tablet by mouth daily.    Marland Kitchen EPINEPHrine 0.3 mg/0.3 mL IJ SOAJ injection Inject 0.3 mg into the muscle as needed for anaphylaxis.    Marland Kitchen estradiol (ESTRACE) 1 MG tablet Take 1 mg by mouth daily.    . fexofenadine (ALLEGRA) 180 MG tablet Take 180 mg by mouth daily.    . fluticasone (FLONASE) 50 MCG/ACT nasal spray Place into both nostrils daily.    . Multiple Vitamin (MULTIVITAMIN) tablet Take 1 tablet by mouth daily.    . pantoprazole (PROTONIX) 40 MG tablet Take 1 tablet (40 mg total) by mouth daily. 90 tablet 1  . progesterone (PROMETRIUM) 100 MG capsule progesterone micronized 100 mg capsule     No facility-administered medications prior to visit.        Objective:   Physical Exam Vitals:   09/05/20 1434  BP: 112/78  Pulse: 100  Temp: 97.9 F (36.6 C)  SpO2: 99%  Weight: 174 lb 3.2 oz (79 kg)  Height: 5\' 3"  (1.6 m)   Gen: Pleasant, well-nourished, in no distress,  normal affect, no cough  ENT: No lesions,  mouth clear,  oropharynx clear, no postnasal drip  Neck: No JVD, no stridor  Lungs: No use of accessory muscles, no crackles or wheezing on normal respiration, no wheeze on forced expiration  Cardiovascular: RRR, heart sounds normal, no murmur or gallops, no peripheral edema  Musculoskeletal: No deformities, no cyanosis or clubbing  Neuro: alert, awake, non focal  Skin: Warm, no lesions or rash       Assessment & Plan:  ILD (interstitial lung disease) (HCC) Marked improvement on her CT scan of the chest from today 09/05/2020.  There may be some subtle evidence of residual bronchiolitis, most of her groundglass infiltrate has resolved.  Her pulmonary function testing is normal.  I do not think she needs any surveillance chest x-rays or CT chest.  If she has a change in symptoms, develops dyspnea then  we would evaluate further.  Pneumonia due to COVID-19 virus Thankfully no evidence of longstanding sequela based on her evaluation.  Discussed social distancing, mask, vaccine etc.  Baltazar Apo, MD, PhD 09/05/2020, 3:05 PM [facility] [phone] or if no answer [phone]

## 2020-09-05 NOTE — Assessment & Plan Note (Signed)
Thankfully no evidence of longstanding sequela based on her evaluation.  Discussed social distancing, mask, vaccine etc.

## 2020-09-05 NOTE — Assessment & Plan Note (Signed)
Marked improvement on her CT scan of the chest from today 09/05/2020.  There may be some subtle evidence of residual bronchiolitis, most of her groundglass infiltrate has resolved.  Her pulmonary function testing is normal.  I do not think she needs any surveillance chest x-rays or CT chest.  If she has a change in symptoms, develops dyspnea then we would evaluate further.

## 2020-09-05 NOTE — Patient Instructions (Signed)
Your pulmonary function testing is normal without any evidence of downstream effect from your COVID-19 pneumonitis Your CT scan of the chest from 09/05/2020 is much improved with almost full resolution of COVID-19 inflammation.  This is good news.  You should not need any repeat chest x-ray or scans unless your breathing changes Follow Dr. Lamonte Sakai if needed for any changes in your breathing.

## 2020-09-21 DIAGNOSIS — L720 Epidermal cyst: Secondary | ICD-10-CM | POA: Diagnosis not present

## 2020-09-21 DIAGNOSIS — D225 Melanocytic nevi of trunk: Secondary | ICD-10-CM | POA: Diagnosis not present

## 2020-09-21 DIAGNOSIS — D2371 Other benign neoplasm of skin of right lower limb, including hip: Secondary | ICD-10-CM | POA: Diagnosis not present

## 2020-09-21 DIAGNOSIS — L578 Other skin changes due to chronic exposure to nonionizing radiation: Secondary | ICD-10-CM | POA: Diagnosis not present

## 2020-10-05 ENCOUNTER — Encounter: Payer: Self-pay | Admitting: Internal Medicine

## 2020-10-05 DIAGNOSIS — U071 COVID-19: Secondary | ICD-10-CM | POA: Diagnosis not present

## 2020-10-06 ENCOUNTER — Other Ambulatory Visit: Payer: Self-pay | Admitting: Internal Medicine

## 2020-10-06 DIAGNOSIS — U071 COVID-19: Secondary | ICD-10-CM

## 2020-10-10 ENCOUNTER — Telehealth: Payer: BC Managed Care – PPO | Admitting: Family

## 2020-10-10 DIAGNOSIS — U071 COVID-19: Secondary | ICD-10-CM

## 2020-10-10 DIAGNOSIS — J329 Chronic sinusitis, unspecified: Secondary | ICD-10-CM | POA: Diagnosis not present

## 2020-10-10 DIAGNOSIS — J019 Acute sinusitis, unspecified: Secondary | ICD-10-CM | POA: Diagnosis not present

## 2020-10-10 DIAGNOSIS — Z9189 Other specified personal risk factors, not elsewhere classified: Secondary | ICD-10-CM | POA: Diagnosis not present

## 2020-10-10 DIAGNOSIS — B9689 Other specified bacterial agents as the cause of diseases classified elsewhere: Secondary | ICD-10-CM | POA: Diagnosis not present

## 2020-10-10 MED ORDER — ALBUTEROL SULFATE HFA 108 (90 BASE) MCG/ACT IN AERS: 2.0000 | INHALATION_SPRAY | Freq: Four times a day (QID) | RESPIRATORY_TRACT | 0 refills | Status: DC | PRN

## 2020-10-10 MED ORDER — AMOXICILLIN-POT CLAVULANATE 875-125 MG PO TABS: 1.0000 | ORAL_TABLET | Freq: Two times a day (BID) | ORAL | 0 refills | Status: DC

## 2020-10-10 MED ORDER — DEXAMETHASONE 6 MG PO TABS: 6.0000 mg | ORAL_TABLET | Freq: Two times a day (BID) | ORAL | 0 refills | Status: DC

## 2020-10-10 MED ORDER — BENZONATATE 100 MG PO CAPS: 100.0000 mg | ORAL_CAPSULE | Freq: Three times a day (TID) | ORAL | 0 refills | Status: DC | PRN

## 2020-10-10 NOTE — Addendum Note (Signed)
Addended by: Evelina Dun A on: 10/10/2020 01:19 PM   Modules accepted: Orders

## 2020-10-10 NOTE — Progress Notes (Signed)

## 2020-10-10 NOTE — Progress Notes (Signed)
E-Visit for Corona Virus Screening  Your current symptoms could be consistent with the coronavirus.  Many health care providers can now test patients at their office but not all are.  [facility] has multiple testing sites. For information on our COVID testing locations and hours go to [email or web address]  We are enrolling you in our MyChart Home Monitoring for COVID19 . Daily you will receive a questionnaire within the MyChart website. Our COVID 19 response team will be monitoring your responses daily.  Testing Information: The COVID-19 Community Testing sites are testing BY APPOINTMENT ONLY.  You can schedule online at [email or web address]  If you do not have access to a smart phone or computer you may call [phone] for an appointment.   Additional testing sites in the Community:  . For [facility] Testing sites in [place]  https://www.cvs.com/minuteclinic/covid-19-testing  . For Pop-up testing sites in [place]  https://covid19.ncdhhs.gov/about-covid-19/testing/find-my-testing-place/pop-testing-sites  . For [facility] https://[place].gov[place]/human-services/health-department/coronavirus-covid-19-info/covid-19-testing  . For [place] testing in [place] and [place] https://www.[place].gov/[place]/human-services/health-department/coronavirus-covid-19-info/covid-19-testing  . For [facility] testing in [place]   https://[facility].care/covidtesting  For  more information about community testing call [phone]   Please quarantine yourself while awaiting your test results. Please stay home for a minimum of 10 days from the first day of illness with improving symptoms and you have had 24 hours of no fever (without the use of Tylenol (Acetaminophen) Motrin (Ibuprofen) or any fever reducing medication).  Also - Do not get tested prior to returning to work because once you have had a positive test the test can stay  positive for more than a month in some cases.   You should wear a mask or cloth face covering over your nose and mouth if you must be around other people or animals, including pets (even at home). Try to stay at least 6 feet away from other people. This will protect the people around you.  Please continue good preventive care measures, including:  frequent hand-washing, avoid touching your face, cover coughs/sneezes, stay out of crowds and keep a 6 foot distance from others.  COVID-19 is a respiratory illness with symptoms that are similar to the flu. Symptoms are typically mild to moderate, but there have been cases of severe illness and death due to the virus.   The following symptoms may appear 2-14 days after exposure: . Fever . Cough . Shortness of breath or difficulty breathing . Chills . Repeated shaking with chills . Muscle pain . Headache . Sore throat . New loss of taste or smell . Fatigue . Congestion or runny nose . Nausea or vomiting . Diarrhea  Go to the nearest hospital ED for assessment if fever/cough/breathlessness are severe or illness seems like a threat to life.  It is vitally important that if you feel that you have an infection such as this virus or any other virus that you stay home and away from places where you may spread it to others.  You should avoid contact with people age 65 and older.   You can use medication such as A prescription cough medication called Tessalon Perles 100 mg. You may take 1-2 capsules every 8 hours as needed for cough, A prescription inhaler called Albuterol MDI 90 mcg /actuation 2 puffs every 4 hours as needed for shortness of breath, wheezing, cough and A prescription for Fluticasone nasal spray 2 sprays in each nostril one time per day and dexamethasone 6 mg twice a day for 7 days.   You may also take   acetaminophen (Tylenol) as needed for fever.  Reduce your risk of any infection by using the same precautions used for avoiding the common  cold or flu:  . Wash your hands often with soap and warm water for at least 20 seconds.  If soap and water are not readily available, use an alcohol-based hand sanitizer with at least 60% alcohol.  . If coughing or sneezing, cover your mouth and nose by coughing or sneezing into the elbow areas of your shirt or coat, into a tissue or into your sleeve (not your hands). . Avoid shaking hands with others and consider head nods or verbal greetings only. . Avoid touching your eyes, nose, or mouth with unwashed hands.  . Avoid close contact with people who are sick. . Avoid places or events with large numbers of people in one location, like concerts or sporting events. . Carefully consider travel plans you have or are making. . If you are planning any travel outside or inside the [place], visit the CDC's Travelers' Health webpage for the latest health notices. . If you have some symptoms but not all symptoms, continue to monitor at home and seek medical attention if your symptoms worsen. . If you are having a medical emergency, call 911.  HOME CARE . Only take medications as instructed by your medical team. . Drink plenty of fluids and get plenty of rest. . A steam or ultrasonic humidifier can help if you have congestion.   GET HELP RIGHT AWAY IF YOU HAVE EMERGENCY WARNING SIGNS** FOR COVID-19. If you or someone is showing any of these signs seek emergency medical care immediately. Call 911 or proceed to your closest emergency facility if: . You develop worsening high fever. . Trouble breathing . Bluish lips or face . Persistent pain or pressure in the chest . New confusion . Inability to wake or stay awake . You cough up blood. . Your symptoms become more severe  **This list is not all possible symptoms. Contact your medical provider for any symptoms that are sever or concerning to you.  MAKE SURE YOU   Understand these instructions.  Will watch your condition.  Will get help right away if  you are not doing well or get worse.  Your e-visit answers were reviewed by a board certified advanced clinical practitioner to complete your personal care plan.  Depending on the condition, your plan could have included both over the counter or prescription medications.  If there is a problem please reply once you have received a response from your provider.  Your safety is important to us.  If you have drug allergies check your prescription carefully.    You can use MyChart to ask questions about today's visit, request a non-urgent call back, or ask for a work or school excuse for 24 hours related to this e-Visit. If it has been greater than 24 hours you will need to follow up with your provider, or enter a new e-Visit to address those concerns. You will get an e-mail in the next two days asking about your experience.  I hope that your e-visit has been valuable and will speed your recovery. Thank you for using e-visits.  Approximately 5 minutes was spent documenting and reviewing patient's chart.    

## 2020-10-11 ENCOUNTER — Encounter: Payer: Self-pay | Admitting: Internal Medicine

## 2020-10-11 ENCOUNTER — Telehealth: Payer: Self-pay

## 2020-10-11 NOTE — Telephone Encounter (Signed)
BPA triggered for SOB and cough.  Called pt. Pt states that earlier today she went out to bring up trash cans and get mail. After which pt was SOB and had a cough. Pt sat quietly for an hour and then felt better. Pulse Ox was 96%.   Pt states that she has not been moving around much. Pt was prescribed antibiotics and steroids yesterday by PCP.   This is the pt's 2nd time having COVID, and is vaccinated.  Pt has an inspiration spirometer and will start to use it to get air to the bottom of her lungs. She will also start doing deep breathing.    Pt will monitor O2 levels.  Pt is interested in Monoclonal anti  body treatment and has called to be on list - but has not gotten a response. Explained amount of demand for therapy. Pt is also interested in anti-viral medications. Recommended F/U with PCP for medication.  Pt agreed with plan and will F/U with PCP for antiviral meds, and will seek care if O2 sats are low or if she develops other breathing issues.

## 2020-11-20 ENCOUNTER — Encounter: Payer: Self-pay | Admitting: Internal Medicine

## 2020-11-21 ENCOUNTER — Other Ambulatory Visit: Payer: Self-pay | Admitting: Internal Medicine

## 2020-11-21 DIAGNOSIS — T753XXD Motion sickness, subsequent encounter: Secondary | ICD-10-CM

## 2020-11-21 MED ORDER — SCOPOLAMINE 1 MG/3DAYS TD PT72: 1.0000 | MEDICATED_PATCH | TRANSDERMAL | 12 refills | Status: DC

## 2020-11-30 ENCOUNTER — Other Ambulatory Visit: Payer: Self-pay | Admitting: Family

## 2020-11-30 DIAGNOSIS — U071 COVID-19: Secondary | ICD-10-CM

## 2021-02-07 ENCOUNTER — Other Ambulatory Visit: Payer: Self-pay

## 2021-02-07 ENCOUNTER — Encounter: Payer: Self-pay | Admitting: Family Medicine

## 2021-02-07 ENCOUNTER — Encounter: Payer: Self-pay | Admitting: Internal Medicine

## 2021-02-07 ENCOUNTER — Ambulatory Visit (INDEPENDENT_AMBULATORY_CARE_PROVIDER_SITE_OTHER): Payer: BC Managed Care – PPO | Admitting: Family Medicine

## 2021-02-07 VITALS — BP 108/70 | HR 69 | Temp 98.6°F | Ht 63.0 in | Wt 171.6 lb

## 2021-02-07 DIAGNOSIS — N390 Urinary tract infection, site not specified: Secondary | ICD-10-CM | POA: Diagnosis not present

## 2021-02-07 DIAGNOSIS — R319 Hematuria, unspecified: Secondary | ICD-10-CM | POA: Diagnosis not present

## 2021-02-07 LAB — POC URINALSYSI DIPSTICK (AUTOMATED)
Bilirubin, UA: NEGATIVE
Glucose, UA: NEGATIVE
Ketones, UA: NEGATIVE
Nitrite, UA: NEGATIVE
Protein, UA: NEGATIVE
Spec Grav, UA: 1.025 (ref 1.010–1.025)
Urobilinogen, UA: 0.2 E.U./dL
pH, UA: 5.5 (ref 5.0–8.0)

## 2021-02-07 MED ORDER — CIPROFLOXACIN HCL 500 MG PO TABS: 500.0000 mg | ORAL_TABLET | Freq: Two times a day (BID) | ORAL | 0 refills | Status: DC

## 2021-02-07 NOTE — Progress Notes (Signed)
   Subjective:    Patient ID: Monique Wang, female    DOB: [birth year], 55 y.o.   MRN: [id number]  HPI Here for 3 days of urinary pressure and burning. Then this morning she saw some blood in the urine. Drinking plenty of water. No fever or nausea or back pain.    Review of Systems  Constitutional: Negative.   Respiratory: Negative.   Cardiovascular: Negative.   Gastrointestinal: Negative.   Genitourinary: Positive for dysuria, frequency, hematuria and urgency. Negative for flank pain.       Objective:   Physical Exam Constitutional:      Appearance: Normal appearance. She is not ill-appearing.  Cardiovascular:     Rate and Rhythm: Normal rate and regular rhythm.     Pulses: Normal pulses.     Heart sounds: Normal heart sounds.  Pulmonary:     Effort: Pulmonary effort is normal.     Breath sounds: Normal breath sounds.  Abdominal:     General: Abdomen is flat. Bowel sounds are normal. There is no distension.     Palpations: Abdomen is soft. There is no mass.     Tenderness: There is no abdominal tenderness. There is no right CVA tenderness, left CVA tenderness, guarding or rebound.     Hernia: No hernia is present.  Neurological:     Mental Status: She is alert.           Assessment & Plan:  UTI, treat with 7 days of Cipro. Culture the sample.  Monique Penna, MD

## 2021-02-08 LAB — URINE CULTURE
MICRO NUMBER:: 11905818
Result:: NO GROWTH
SPECIMEN QUALITY:: ADEQUATE

## 2021-05-14 DIAGNOSIS — Z1231 Encounter for screening mammogram for malignant neoplasm of breast: Secondary | ICD-10-CM | POA: Diagnosis not present

## 2021-05-14 DIAGNOSIS — Z6830 Body mass index (BMI) 30.0-30.9, adult: Secondary | ICD-10-CM | POA: Diagnosis not present

## 2021-05-14 DIAGNOSIS — Z01419 Encounter for gynecological examination (general) (routine) without abnormal findings: Secondary | ICD-10-CM | POA: Diagnosis not present

## 2021-05-14 DIAGNOSIS — R319 Hematuria, unspecified: Secondary | ICD-10-CM | POA: Diagnosis not present

## 2021-05-28 DIAGNOSIS — L255 Unspecified contact dermatitis due to plants, except food: Secondary | ICD-10-CM | POA: Diagnosis not present

## 2021-06-21 DIAGNOSIS — D2261 Melanocytic nevi of right upper limb, including shoulder: Secondary | ICD-10-CM | POA: Diagnosis not present

## 2021-06-21 DIAGNOSIS — D225 Melanocytic nevi of trunk: Secondary | ICD-10-CM | POA: Diagnosis not present

## 2021-06-21 DIAGNOSIS — D2271 Melanocytic nevi of right lower limb, including hip: Secondary | ICD-10-CM | POA: Diagnosis not present

## 2021-06-21 DIAGNOSIS — Z86018 Personal history of other benign neoplasm: Secondary | ICD-10-CM | POA: Diagnosis not present

## 2021-06-21 DIAGNOSIS — Z23 Encounter for immunization: Secondary | ICD-10-CM | POA: Diagnosis not present

## 2021-06-27 ENCOUNTER — Other Ambulatory Visit: Payer: Self-pay

## 2021-06-28 ENCOUNTER — Encounter: Payer: Self-pay | Admitting: Internal Medicine

## 2021-06-28 ENCOUNTER — Ambulatory Visit (INDEPENDENT_AMBULATORY_CARE_PROVIDER_SITE_OTHER): Payer: BC Managed Care – PPO | Admitting: Internal Medicine

## 2021-06-28 VITALS — BP 110/80 | HR 59 | Temp 97.9°F | Ht 63.0 in | Wt 169.7 lb

## 2021-06-28 DIAGNOSIS — J849 Interstitial pulmonary disease, unspecified: Secondary | ICD-10-CM | POA: Diagnosis not present

## 2021-06-28 DIAGNOSIS — H9193 Unspecified hearing loss, bilateral: Secondary | ICD-10-CM | POA: Diagnosis not present

## 2021-06-28 DIAGNOSIS — K219 Gastro-esophageal reflux disease without esophagitis: Secondary | ICD-10-CM

## 2021-06-28 DIAGNOSIS — Z0000 Encounter for general adult medical examination without abnormal findings: Secondary | ICD-10-CM | POA: Diagnosis not present

## 2021-06-28 LAB — CBC WITH DIFFERENTIAL/PLATELET
Basophils Absolute: 0.1 10*3/uL (ref 0.0–0.1)
Basophils Relative: 0.9 % (ref 0.0–3.0)
Eosinophils Absolute: 0.4 10*3/uL (ref 0.0–0.7)
Eosinophils Relative: 6.8 % — ABNORMAL HIGH (ref 0.0–5.0)
HCT: 41.2 % (ref 36.0–46.0)
Hemoglobin: 13.7 g/dL (ref 12.0–15.0)
Lymphocytes Relative: 19 % (ref 12.0–46.0)
Lymphs Abs: 1.1 10*3/uL (ref 0.7–4.0)
MCHC: 33.2 g/dL (ref 30.0–36.0)
MCV: 96 fl (ref 78.0–100.0)
Monocytes Absolute: 0.6 10*3/uL (ref 0.1–1.0)
Monocytes Relative: 9.8 % (ref 3.0–12.0)
Neutro Abs: 3.6 10*3/uL (ref 1.4–7.7)
Neutrophils Relative %: 63.5 % (ref 43.0–77.0)
Platelets: 335 10*3/uL (ref 150.0–400.0)
RBC: 4.3 Mil/uL (ref 3.87–5.11)
RDW: 13.1 % (ref 11.5–15.5)
WBC: 5.7 10*3/uL (ref 4.0–10.5)

## 2021-06-28 LAB — LIPID PANEL
Cholesterol: 137 mg/dL (ref 0–200)
HDL: 67.5 mg/dL (ref >39.00)
LDL Cholesterol: 42 mg/dL (ref 0–99)
NonHDL: 69.71
Total CHOL/HDL Ratio: 2
Triglycerides: 138 mg/dL (ref 0.0–149.0)
VLDL: 27.6 mg/dL (ref 0.0–40.0)

## 2021-06-28 LAB — COMPREHENSIVE METABOLIC PANEL
ALT: 14 U/L (ref 0–35)
AST: 17 U/L (ref 0–37)
Albumin: 3.7 g/dL (ref 3.5–5.2)
Alkaline Phosphatase: 103 U/L (ref 39–117)
BUN: 17 mg/dL (ref 6–23)
CO2: 29 mEq/L (ref 19–32)
Calcium: 9.1 mg/dL (ref 8.4–10.5)
Chloride: 104 mEq/L (ref 96–112)
Creatinine, Ser: 0.74 mg/dL (ref 0.40–1.20)
GFR: 92.44 mL/min (ref >60.00)
Glucose, Bld: 84 mg/dL (ref 70–99)
Potassium: 4.1 mEq/L (ref 3.5–5.1)
Sodium: 139 mEq/L (ref 135–145)
Total Bilirubin: 0.4 mg/dL (ref 0.2–1.2)
Total Protein: 6.2 g/dL (ref 6.0–8.3)

## 2021-06-28 LAB — VITAMIN B12: Vitamin B-12: 773 pg/mL (ref 211–911)

## 2021-06-28 LAB — VITAMIN D 25 HYDROXY (VIT D DEFICIENCY, FRACTURES): VITD: 41.06 ng/mL (ref 30.00–100.00)

## 2021-06-28 LAB — TSH: TSH: 1.25 u[IU]/mL (ref 0.35–5.50)

## 2021-06-28 LAB — HEMOGLOBIN A1C: Hgb A1c MFr Bld: 5.4 % (ref 4.6–6.5)

## 2021-06-28 MED ORDER — PANTOPRAZOLE SODIUM 40 MG PO TBEC: 40.0000 mg | DELAYED_RELEASE_TABLET | Freq: Two times a day (BID) | ORAL | 1 refills | Status: DC

## 2021-06-28 NOTE — Addendum Note (Signed)
Addended by: Amanda Cockayne on: 06/28/2021 07:28 AM   Modules accepted: Orders

## 2021-06-28 NOTE — Progress Notes (Signed)
Established Patient Office Visit     This visit occurred during the SARS-CoV-2 public health emergency.  Safety protocols were in place, including screening questions prior to the visit, additional usage of staff PPE, and extensive cleaning of exam room while observing appropriate contact time as indicated for disinfecting solutions.    CC/Reason for Visit: Annual preventive exam  HPI: Monique Wang is a 55 y.o. female who is coming in today for the above mentioned reasons. Past Medical History is significant for: Interstitial lung disease due to COVID-pneumonia with significantly improved symptoms although she still gets dyspnea on exertion.  She has been released from pulmonary care.  She has routine eye and dental care.  She has noted some difficulty hearing.  She also has a history of GERD and is requesting that we increase her PPI to twice daily due to symptoms are not completely resolved.  She is due for the new COVID booster but otherwise all immunizations are up-to-date.  She follows with GYN Dr. Julien Girt and will be due for Pap smear next year, she states she had a mammogram in her office over the summer although I do not have results of this.  She had a colonoscopy in 2021.   Past Medical/Surgical History: Past Medical History:  Diagnosis Date   Allergy    environmental   Bronchiectasis ([place])    COVID-19 01/2020   GERD (gastroesophageal reflux disease)    Pneumonia     Past Surgical History:  Procedure Laterality Date   CARPAL TUNNEL RELEASE Right    ganglion cyst removal   CESAREAN SECTION     HERNIA REPAIR     SHOULDER SURGERY     uterine ablation      Social History:  reports that she has never smoked. She has never used smokeless tobacco. She reports that she does not drink alcohol and does not use drugs.  Allergies: Allergies  Allergen Reactions   Honey Bee Venom Protein [Bee Venom] Anaphylaxis   Grass Pollen(K-O-R-T-Swt Vern) Cough, Other (See Comments)  and Rash   Erythromycin Rash    Family History:  Family History  Problem Relation Age of Onset   Hypertension Mother    Hyperlipidemia Father    Stroke Father    Colon polyps Father    Colon cancer Father    Colon cancer Paternal Grandmother    Esophageal cancer Neg Hx    Stomach cancer Neg Hx    Rectal cancer Neg Hx      Current Outpatient Medications:    albuterol (VENTOLIN HFA) 108 (90 Base) MCG/ACT inhaler, Inhale 2 puffs into the lungs every 6 (six) hours as needed for wheezing or shortness of breath., Disp: 8 g, Rfl: 0   D 2000 50 MCG (2000 UT) TABS, Take 1 tablet by mouth daily., Disp: , Rfl:    EPINEPHrine 0.3 mg/0.3 mL IJ SOAJ injection, Inject 0.3 mg into the muscle as needed for anaphylaxis., Disp: , Rfl:    estradiol (ESTRACE) 1 MG tablet, Take 1 mg by mouth daily., Disp: , Rfl:    fexofenadine (ALLEGRA) 180 MG tablet, Take 180 mg by mouth daily., Disp: , Rfl:    Multiple Vitamin (MULTIVITAMIN) tablet, Take 1 tablet by mouth daily., Disp: , Rfl:    progesterone (PROMETRIUM) 100 MG capsule, progesterone micronized 100 mg capsule, Disp: , Rfl:    scopolamine (TRANSDERM-SCOP, 1.5 MG,) 1 MG/3DAYS, Place 1 patch (1.5 mg total) onto the skin every 3 (three) days., Disp: 10  patch, Rfl: 12   pantoprazole (PROTONIX) 40 MG tablet, Take 1 tablet (40 mg total) by mouth 2 (two) times daily., Disp: 180 tablet, Rfl: 1  Review of Systems:  Constitutional: Denies fever, chills, diaphoresis, appetite change and fatigue.  HEENT: Denies photophobia, eye pain, redness, hearing loss, ear pain, congestion, sore throat, rhinorrhea, sneezing, mouth sores, trouble swallowing, neck pain, neck stiffness and tinnitus.   Respiratory: Denies SOB, DOE, cough, chest tightness,  and wheezing.   Cardiovascular: Denies chest pain, palpitations and leg swelling.  Gastrointestinal: Denies nausea, vomiting, abdominal pain, diarrhea, constipation, blood in stool and abdominal distention.  Genitourinary:  Denies dysuria, urgency, frequency, hematuria, flank pain and difficulty urinating.  Endocrine: Denies: hot or cold intolerance, sweats, changes in hair or nails, polyuria, polydipsia. Musculoskeletal: Denies myalgias, back pain, joint swelling, arthralgias and gait problem.  Skin: Denies pallor, rash and wound.  Neurological: Denies dizziness, seizures, syncope, weakness, light-headedness, numbness and headaches.  Hematological: Denies adenopathy. Easy bruising, personal or family bleeding history  Psychiatric/Behavioral: Denies suicidal ideation, mood changes, confusion, nervousness, sleep disturbance and agitation    Physical Exam: Vitals:   06/28/21 0657  BP: 110/80  Pulse: (!) 59  Temp: 97.9 F (36.6 C)  TempSrc: Oral  SpO2: 97%  Weight: 169 lb 11.2 oz (77 kg)  Height: 5\' 3"  (1.6 m)    Body mass index is 30.06 kg/m.   Constitutional: NAD, calm, comfortable Eyes: PERRL, lids and conjunctivae normal ENMT: Mucous membranes are moist. Posterior pharynx clear of any exudate or lesions. Normal dentition. Tympanic membrane is pearly white, no erythema or bulging. Neck: normal, supple, no masses, no thyromegaly Respiratory: clear to auscultation bilaterally, no wheezing, no crackles. Normal respiratory effort. No accessory muscle use.  Cardiovascular: Regular rate and rhythm, no murmurs / rubs / gallops. No extremity edema. 2+ pedal pulses. No carotid bruits.  Abdomen: no tenderness, no masses palpated. No hepatosplenomegaly. Bowel sounds positive.  Musculoskeletal: no clubbing / cyanosis. No joint deformity upper and lower extremities. Good ROM, no contractures. Normal muscle tone.  Skin: no rashes, lesions, ulcers. No induration Neurologic: CN 2-12 grossly intact. Sensation intact, DTR normal. Strength 5/5 in all 4.  Psychiatric: Normal judgment and insight. Alert and oriented x 3. Normal mood.    Impression and Plan:  Encounter for preventive health examination -Advised  routine eye and dental care. -She will get the new COVID booster at pharmacy but otherwise immunizations are up-to-date. -Labs to be updated today. -Healthy lifestyle discussed in detail. -She had a colonoscopy in 2021 and is a 10-year callback. -She had a mammogram in July 2022 that is negative per patient report. -She will be due for Pap smear in 2023 and gets these done with her GYN.  Gastroesophageal reflux disease without esophagitis  - Plan: pantoprazole (PROTONIX) 40 MG tablet, will increase to twice daily.  ILD (interstitial lung disease) ([place])  - Plan: CBC with Differential/Platelet, Comprehensive metabolic panel, Hemoglobin A1c, Lipid panel, TSH, Vitamin B12, VITAMIN D 25 Hydroxy (Vit-D Deficiency, Fractures) -Still with mild dyspnea on exertion but overall improving.  Bilateral hearing loss, unspecified hearing loss type -Refer to audiology.    Patient Instructions  -Nice seeing you today!!  -Lab work today; will notify you once results are available.  -Remember your COVID booster.  -Schedule follow up in 1 year or sooner as needed.      Lelon Frohlich, MD [facility]

## 2021-06-28 NOTE — Patient Instructions (Signed)
-  Nice seeing you today!!  -Lab work today; will notify you once results are available.  -Remember your COVID booster.  -Schedule follow up in 1 year or sooner as needed.

## 2021-07-09 ENCOUNTER — Ambulatory Visit: Payer: BC Managed Care – PPO | Attending: Internal Medicine | Admitting: Audiologist

## 2021-07-09 ENCOUNTER — Other Ambulatory Visit: Payer: Self-pay

## 2021-07-09 DIAGNOSIS — H903 Sensorineural hearing loss, bilateral: Secondary | ICD-10-CM | POA: Diagnosis not present

## 2021-07-09 NOTE — Procedures (Signed)
  [facility] and [place], [place]  [id number] [phone]  AUDIOLOGICAL  EVALUATION  NAME: Monique Wang     DOB:   05-Feb-1966      MRN: [id number]                                                                                     DATE: 07/09/2021     REFERENT: Isaac Bliss, Rayford Halsted, MD STATUS: Outpatient DIAGNOSIS: Sensorineural Hearing Loss Bilateral     History: Monique Wang was seen for an audiological evaluation.  Monique Wang is receiving a hearing evaluation due to concerns for difficulty hearing in noise and over the phone. Monique Wang has difficulty hearing in background noise, crowds, and when people are at a distance. This difficulty began around five years ago and has gradually gotten worse. In 2019 she saw Dr. Mayer Masker office and was told she had mild hearing loss and was not a hearing aid candidate. Monique Wang has a strong family history of hearing loss. Her brother started wearing hearing aids as a teen, and her father has hearing loss. No pain or pressure reported in either ear. Tinnitus denied for both ears. Monique Wang has a history of noise exposure from concerts.  Medical history negative for any health condition which is a risk factor for hearing loss. No other relevant case history reported.    Evaluation:  Otoscopy showed a clear view of the tympanic membranes, bilaterally Tympanometry results were consistent with slight negative pressure in right ear and normal pressure with hypercompliance in the left ear Audiometric testing was completed using conventional audiometry with insert and supraural transducer. Testing with inserts created false conductive component, hearing improved with supraural headphones. Speech Recognition Thresholds were consistent with pure tone averages. Word Recognition was  92% or better at conversation and an elevated level in each ear. Pure tone thresholds show normal to mild sensorineural hearing loss in both  ears.  Results:  The test results were reviewed with Monique Wang. She has a mild hearing loss in both ear. This will make communication in adverse listening environments such as noise or over the phone difficult. Monique Wang will not have any difficulty communicating face to face within five feet. She is not a hearing aid candidate at this time. Recommend annual hearing tests to monitor progression of mild hearing loss.   Recommendations: 1.   Annual audiometric testing recommended. [name] was told to request a referral for a hearing test at her annual physical to monitor the mild hearing loss in both ears.   Monique Wang  Audiologist, Au.D., CCC-A 07/09/2021  8:43 AM  Cc: Isaac Bliss, Rayford Halsted, MD

## 2021-07-11 ENCOUNTER — Encounter: Payer: Self-pay | Admitting: Audiologist

## 2021-10-13 DIAGNOSIS — J069 Acute upper respiratory infection, unspecified: Secondary | ICD-10-CM | POA: Diagnosis not present

## 2021-10-13 DIAGNOSIS — J029 Acute pharyngitis, unspecified: Secondary | ICD-10-CM | POA: Diagnosis not present

## 2021-10-13 DIAGNOSIS — Z1152 Encounter for screening for COVID-19: Secondary | ICD-10-CM | POA: Diagnosis not present

## 2021-10-13 DIAGNOSIS — Z20822 Contact with and (suspected) exposure to covid-19: Secondary | ICD-10-CM | POA: Diagnosis not present

## 2021-10-15 ENCOUNTER — Other Ambulatory Visit: Payer: Self-pay | Admitting: Internal Medicine

## 2021-10-15 DIAGNOSIS — U071 COVID-19: Secondary | ICD-10-CM

## 2021-10-15 MED ORDER — ALBUTEROL SULFATE HFA 108 (90 BASE) MCG/ACT IN AERS: 2.0000 | INHALATION_SPRAY | Freq: Four times a day (QID) | RESPIRATORY_TRACT | 0 refills | Status: DC | PRN

## 2021-10-16 ENCOUNTER — Other Ambulatory Visit: Payer: Self-pay | Admitting: Internal Medicine

## 2021-10-16 ENCOUNTER — Telehealth: Payer: Self-pay | Admitting: Internal Medicine

## 2021-10-16 ENCOUNTER — Telehealth (INDEPENDENT_AMBULATORY_CARE_PROVIDER_SITE_OTHER): Payer: BC Managed Care – PPO | Admitting: Internal Medicine

## 2021-10-16 VITALS — Temp 100.1°F

## 2021-10-16 DIAGNOSIS — R509 Fever, unspecified: Secondary | ICD-10-CM

## 2021-10-16 DIAGNOSIS — R300 Dysuria: Secondary | ICD-10-CM | POA: Diagnosis not present

## 2021-10-16 DIAGNOSIS — N3001 Acute cystitis with hematuria: Secondary | ICD-10-CM

## 2021-10-16 LAB — POCT URINALYSIS DIPSTICK
Bilirubin, UA: NEGATIVE
Glucose, UA: NEGATIVE
Ketones, UA: NEGATIVE
Leukocytes, UA: NEGATIVE
Nitrite, UA: NEGATIVE
Protein, UA: NEGATIVE
Spec Grav, UA: 1.02 (ref 1.010–1.025)
Urobilinogen, UA: 0.2 E.U./dL
pH, UA: 6 (ref 5.0–8.0)

## 2021-10-16 MED ORDER — SULFAMETHOXAZOLE-TRIMETHOPRIM 800-160 MG PO TABS: 1.0000 | ORAL_TABLET | Freq: Two times a day (BID) | ORAL | 0 refills | Status: AC

## 2021-10-16 NOTE — Telephone Encounter (Signed)
Erline Hau, MD  10/16/2021  4:32 PM EST Back to Top    Blood in urine. Bactrim BID for 5 days sent in. If not better in about a week, may need imaging.   Pt notified of PCP result note & verb understanding. Denies ques/concerns at this time.

## 2021-10-16 NOTE — Telephone Encounter (Signed)
Pt is calling and would like her urine results. Pt was seen today

## 2021-10-16 NOTE — Progress Notes (Signed)
Virtual Visit via Video Note  I connected with Monique Wang on 10/16/21 at  1:00 PM EST by a video enabled telemedicine application and verified that I am speaking with the correct person using two identifiers.  Location patient: home Location provider: work office Persons participating in the virtual visit: patient, provider  I discussed the limitations of evaluation and management by telemedicine and the availability of in person appointments. The patient expressed understanding and agreed to proceed.   HPI: Since January 13 she has been experiencing a sore throat, fever up to 100.9 degrees, chills, myalgias and fatigue.  Many of her coworkers have been out with COVID.  On Saturday she went to urgent care where she tested negative for COVID, flu, strep.  She has had a few more negative COVID tests since then.  In the past 2 to 3 days she started noticing a foul order to her urine, increased urine frequency.  She did an over-the-counter UTI kit that showed positive leukocytes.   ROS: Constitutional: Denies fever, chills, diaphoresis, appetite change. HEENT: Denies photophobia, eye pain, redness, hearing loss, mouth sores, trouble swallowing, neck pain, neck stiffness and tinnitus.   Respiratory: Denies SOB, DOE, cough, chest tightness,  and wheezing.   Cardiovascular: Denies chest pain, palpitations and leg swelling.  Gastrointestinal: Denies nausea, vomiting, abdominal pain, diarrhea, constipation, blood in stool and abdominal distention.   Endocrine: Denies: hot or cold intolerance, sweats, changes in hair or nails, polyuria, polydipsia. Musculoskeletal: Denies myalgias, back pain, joint swelling, arthralgias and gait problem.  Skin: Denies pallor, rash and wound.  Neurological: Denies dizziness, seizures, syncope, weakness, light-headedness, numbness and headaches.  Hematological: Denies adenopathy. Easy bruising, personal or family bleeding history  Psychiatric/Behavioral:  Denies suicidal ideation, mood changes, confusion, nervousness, sleep disturbance and agitation   Past Medical History:  Diagnosis Date   Allergy    environmental   Bronchiectasis (HCC)    COVID-19 01/2020   GERD (gastroesophageal reflux disease)    Pneumonia     Past Surgical History:  Procedure Laterality Date   CARPAL TUNNEL RELEASE Right    ganglion cyst removal   CESAREAN SECTION     HERNIA REPAIR     SHOULDER SURGERY     uterine ablation      Family History  Problem Relation Age of Onset   Hypertension Mother    Hyperlipidemia Father    Stroke Father    Colon polyps Father    Colon cancer Father    Colon cancer Paternal Grandmother    Esophageal cancer Neg Hx    Stomach cancer Neg Hx    Rectal cancer Neg Hx     SOCIAL HX:   reports that she has never smoked. She has never used smokeless tobacco. She reports that she does not drink alcohol and does not use drugs.   Current Outpatient Medications:    albuterol (VENTOLIN HFA) 108 (90 Base) MCG/ACT inhaler, Inhale 2 puffs into the lungs every 6 (six) hours as needed for wheezing or shortness of breath., Disp: 8 g, Rfl: 0   D 2000 50 MCG (2000 UT) TABS, Take 1 tablet by mouth daily., Disp: , Rfl:    EPINEPHrine 0.3 mg/0.3 mL IJ SOAJ injection, Inject 0.3 mg into the muscle as needed for anaphylaxis., Disp: , Rfl:    estradiol (ESTRACE) 1 MG tablet, Take 1 mg by mouth daily., Disp: , Rfl:    fexofenadine (ALLEGRA) 180 MG tablet, Take 180 mg by mouth daily., Disp: ,  Rfl:    Multiple Vitamin (MULTIVITAMIN) tablet, Take 1 tablet by mouth daily., Disp: , Rfl:    pantoprazole (PROTONIX) 40 MG tablet, Take 1 tablet (40 mg total) by mouth 2 (two) times daily., Disp: 180 tablet, Rfl: 1   progesterone (PROMETRIUM) 100 MG capsule, progesterone micronized 100 mg capsule, Disp: , Rfl:    scopolamine (TRANSDERM-SCOP, 1.5 MG,) 1 MG/3DAYS, Place 1 patch (1.5 mg total) onto the skin every 3 (three) days., Disp: 10 patch, Rfl:  12  EXAM:   VITALS per patient if applicable: None reported  GENERAL: alert, oriented, appears well and in no acute distress  HEENT: atraumatic, conjunttiva clear, no obvious abnormalities on inspection of external nose and ears  NECK: normal movements of the head and neck  LUNGS: on inspection no signs of respiratory distress, breathing rate appears normal, no obvious gross increased work of breathing, gasping or wheezing  CV: no obvious cyanosis  MS: moves all visible extremities without noticeable abnormality  PSYCH/NEURO: pleasant and cooperative, no obvious depression or anxiety, speech and thought processing grossly intact  ASSESSMENT AND PLAN:   Dysuria - Plan: POCT Urinalysis Dipstick  Fever, unspecified fever cause - Plan: POCT Urinalysis Dipstick  -Unlikely to be a URI that would require antibiotics.  Given her urinary symptoms have advised that she drop off a urine sample for analysis.  If she has signs indicative of a UTI we could give her antibiotics for that.  In the meantime have advised continued rest, fluids and OTC pain relievers.     I discussed the assessment and treatment plan with the patient. The patient was provided an opportunity to ask questions and all were answered. The patient agreed with the plan and demonstrated an understanding of the instructions.   The patient was advised to call back or seek an in-person evaluation if the symptoms worsen or if the condition fails to improve as anticipated.    Lelon Frohlich, MD  [facility]

## 2021-10-30 IMAGING — CT CT CHEST HIGH RESOLUTION W/O CM
2 of 7 series · 12 of 36 positions shown, 15 images · non-contrast
Comparison: None.

CLINICAL DATA: 9U6NP-CT pneumonia in December 2019. Persistent dyspnea
with exertion and cough. Nonsmoker.

EXAM:
CT CHEST WITHOUT CONTRAST
TECHNIQUE: Multidetector CT imaging of the chest was performed following the
standard protocol without intravenous contrast. High resolution
imaging of the lungs, as well as inspiratory and expiratory imaging,
was performed.

[Series 4: chest 2.00 br36 s3 cor soft · coronal · 0.58mm/px · 3 of 154 slices shown]
[im 31/154  lung]
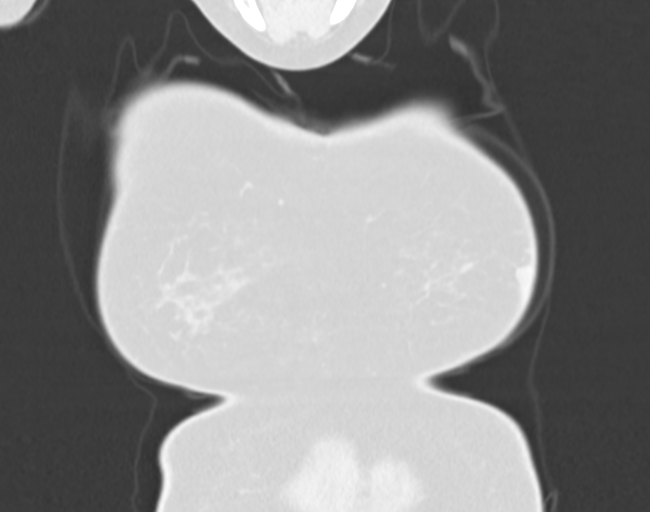
[im 62/154  lung]
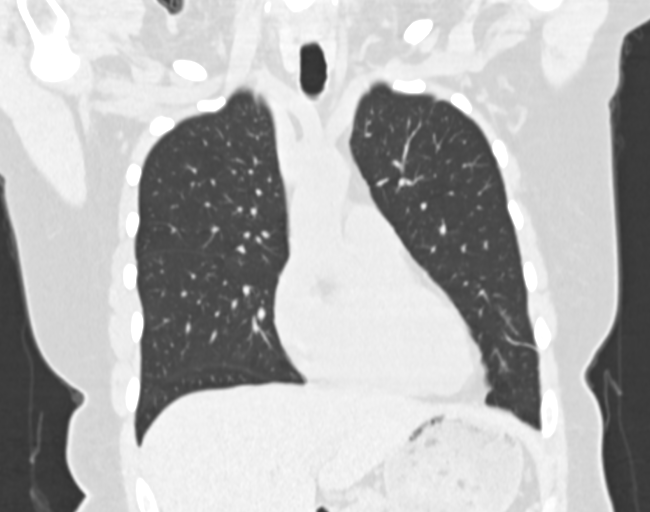
[im 92/154  lung]
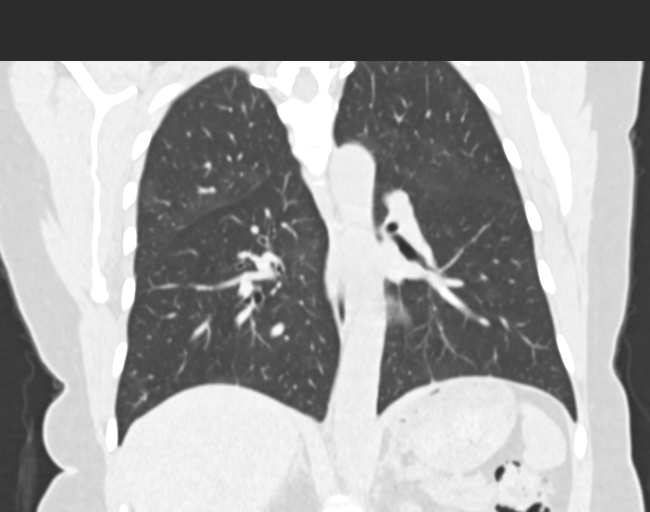

[Series 11: chest 1.00 br60 s3 high res thins 1x1 mm · axial · 0.60mm/px · z∈[+1573,+1825]mm · 9 of 298 slices shown, 12 images]
[im 23/298  mediastinal]
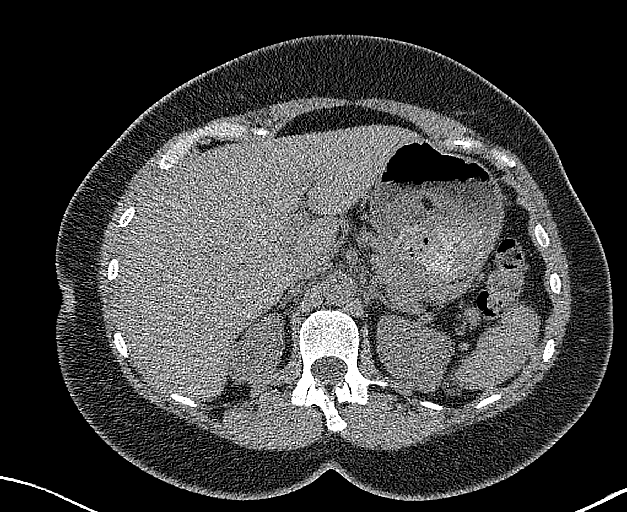
[im 23/298  lung]
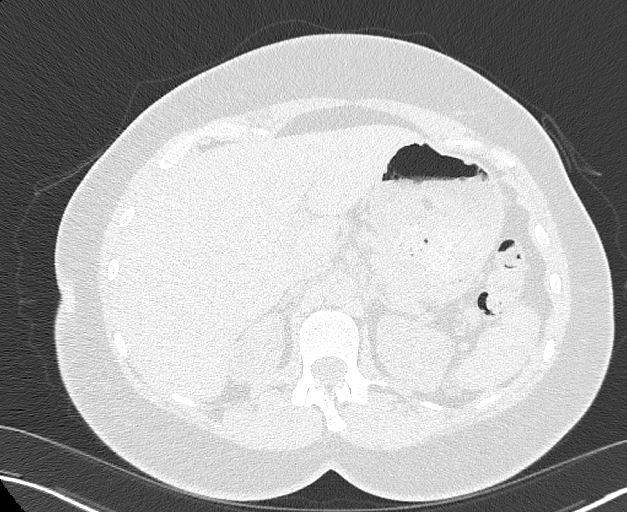
[im 69/298  lung]
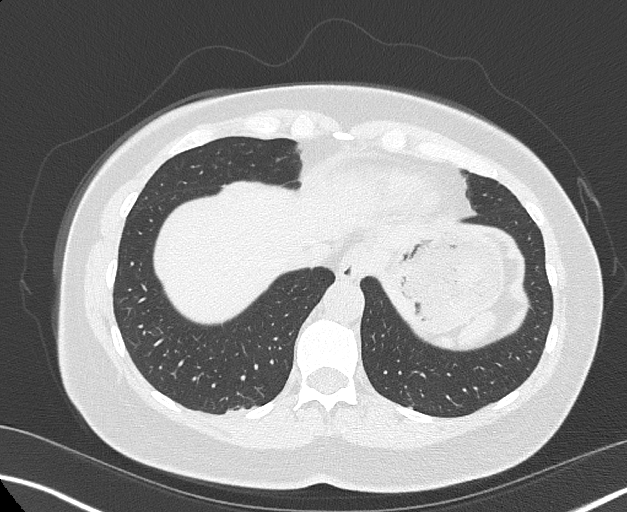
[im 92/298  lung]
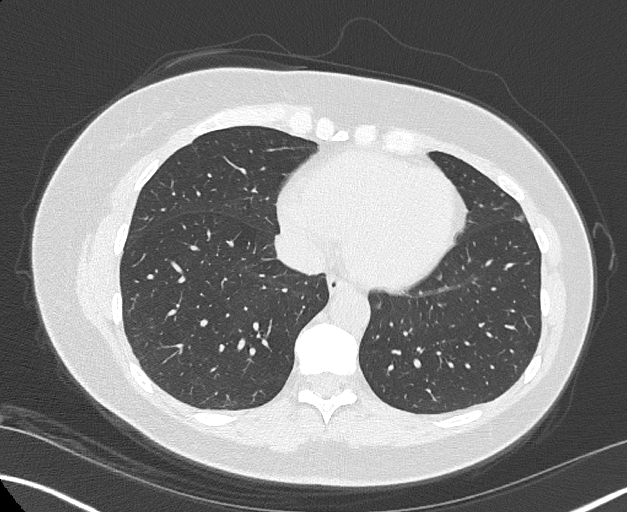
[im 115/298  lung]
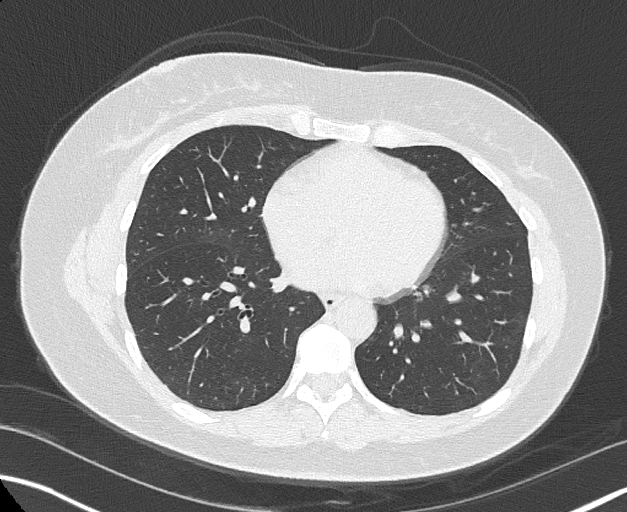
[im 160/298  mediastinal]
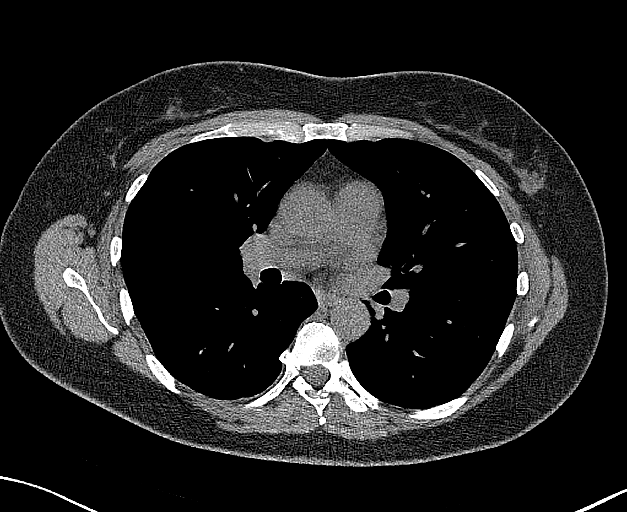
[im 160/298  lung]
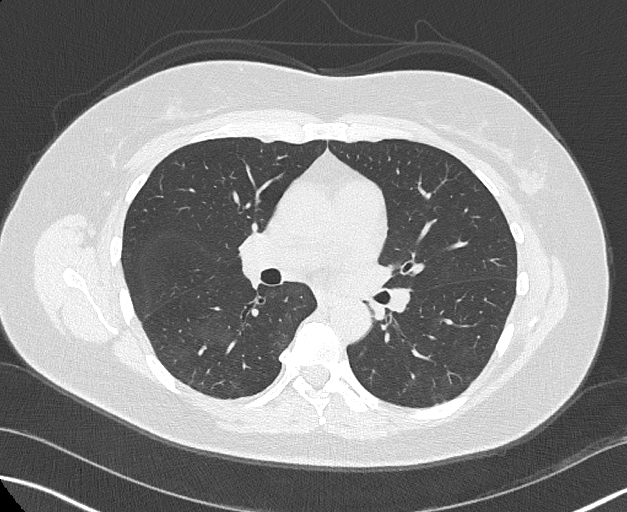
[im 183/298  lung]
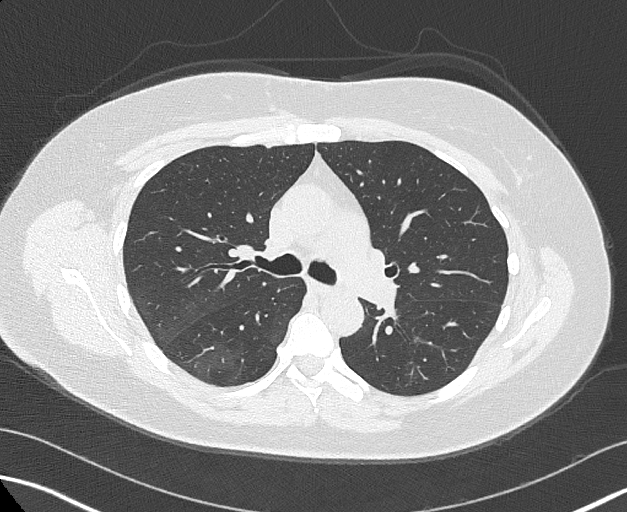
[im 206/298  lung]
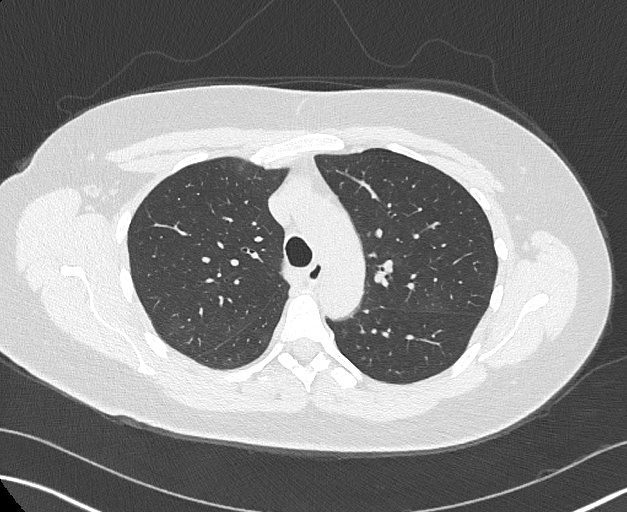
[im 252/298  lung]
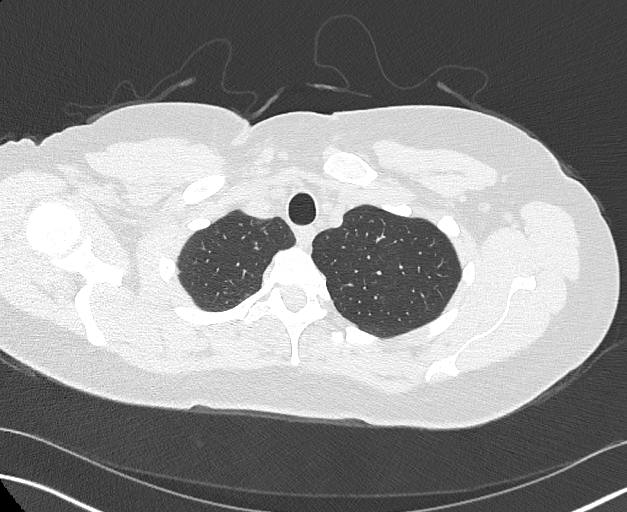
[im 275/298  mediastinal]
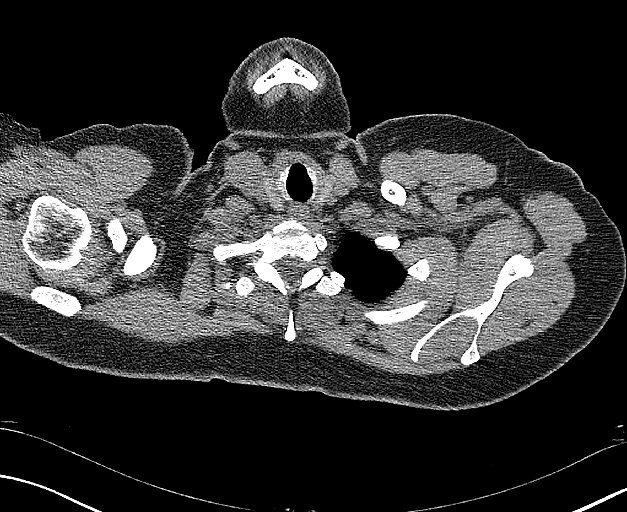
[im 275/298  lung]
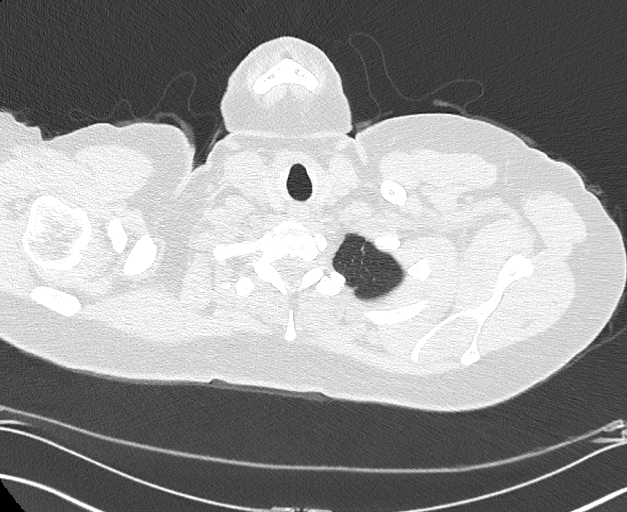

[12 of 36 positions shown; findings below may reference images not displayed]

FINDINGS: Cardiovascular: Normal heart size. No significant pericardial
effusion/thickening. Great vessels are normal in course and caliber.

Mediastinum/Nodes: No discrete thyroid nodules. Unremarkable
esophagus. No pathologically enlarged axillary, mediastinal or hilar
lymph nodes, noting limited sensitivity for the detection of hilar
adenopathy on this noncontrast study.

Lungs/Pleura: No pneumothorax. No pleural effusion. No acute
consolidative airspace disease, lung masses or significant pulmonary
nodules. No significant air trapping or evidence of
tracheobronchomalacia on the expiration sequence. There is mild
patchy ground-glass opacity throughout both lungs involving all lung
lobes without significant regions traction bronchiectasis,
subpleural reticulation, architectural distortion or frank
honeycombing.

Upper abdomen: No acute abnormality.

Musculoskeletal: No aggressive appearing focal osseous lesions.
Minimal thoracic spondylosis.
IMPRESSION: Mild patchy ground-glass opacity throughout both lungs. Given the
reported 9U6NP-CT pneumonia 2-3 months prior, findings are
suggestive of early evolving postinflammatory fibrosis. Suggest
follow-up high-resolution chest CT study in 6-12 months.

## 2021-11-07 ENCOUNTER — Other Ambulatory Visit: Payer: Self-pay | Admitting: Internal Medicine

## 2021-11-07 DIAGNOSIS — U071 COVID-19: Secondary | ICD-10-CM

## 2021-11-09 ENCOUNTER — Encounter: Payer: Self-pay | Admitting: Nurse Practitioner

## 2021-11-09 ENCOUNTER — Other Ambulatory Visit: Payer: Self-pay

## 2021-11-09 ENCOUNTER — Ambulatory Visit (INDEPENDENT_AMBULATORY_CARE_PROVIDER_SITE_OTHER): Payer: No Typology Code available for payment source | Admitting: Nurse Practitioner

## 2021-11-09 VITALS — BP 136/86 | HR 93 | Temp 96.6°F | Wt 176.4 lb

## 2021-11-09 DIAGNOSIS — J4541 Moderate persistent asthma with (acute) exacerbation: Secondary | ICD-10-CM | POA: Diagnosis not present

## 2021-11-09 MED ORDER — PREDNISONE 10 MG PO TABS: ORAL_TABLET | ORAL | 0 refills | Status: DC

## 2021-11-09 MED ORDER — ALBUTEROL SULFATE (2.5 MG/3ML) 0.083% IN NEBU: 2.5000 mg | INHALATION_SOLUTION | Freq: Four times a day (QID) | RESPIRATORY_TRACT | 1 refills | Status: DC | PRN

## 2021-11-09 MED ORDER — METHYLPREDNISOLONE SODIUM SUCC 40 MG IJ SOLR
40.0000 mg | Freq: Once | INTRAMUSCULAR | Status: AC
Start: 2021-11-09 — End: 2021-11-09
  Administered 2021-11-09: 40 mg via INTRAMUSCULAR

## 2021-11-09 NOTE — Progress Notes (Signed)
Acute Office Visit  Subjective:    Patient ID: Monique Wang, female    DOB: 1965-11-22, 56 y.o.   MRN: [id number]  Chief Complaint  Patient presents with   Breathing Problem    Pt c/o asthma realated symtptoms after inhalation of chemical from job. Pt stated SOB, and chest tightness x1 day    HPI Patient is in today for asthma exacerbation after co-workers used some cleaning solutions near her. She endorses chest tightness and shortness of breath that started yesterday. She has albuterol inhaler at home and has used it almost every 2-4 hours. She states that yesterday she could hardly talk and her oxygen levels were around 90%. She went to work again this morning as her symptoms started to improve. She still endorses some chest tightness and shortness of breath, however she is able to talk in complete sentences. She endorses some coughing and is also taking allegra to help with her symptoms. She denies tongue or throat swelling, dizziness, and confusion. She states she has an allergy to dust and grass, but never usually this bad.   Past Medical History:  Diagnosis Date   Allergy    environmental   Bronchiectasis ([place])    COVID-19 01/2020   GERD (gastroesophageal reflux disease)    Pneumonia     Past Surgical History:  Procedure Laterality Date   CARPAL TUNNEL RELEASE Right    ganglion cyst removal   CESAREAN SECTION     HERNIA REPAIR     SHOULDER SURGERY     uterine ablation      Family History  Problem Relation Age of Onset   Hypertension Mother    Hyperlipidemia Father    Stroke Father    Colon polyps Father    Colon cancer Father    Colon cancer Paternal Grandmother    Esophageal cancer Neg Hx    Stomach cancer Neg Hx    Rectal cancer Neg Hx     Social History   Socioeconomic History   Marital status: Married    Spouse name: Not on file   Number of children: Not on file   Years of education: Not on file   Highest education level: Not on file  Occupational  History   Not on file  Tobacco Use   Smoking status: Never   Smokeless tobacco: Never  Vaping Use   Vaping Use: Never used  Substance and Sexual Activity   Alcohol use: No   Drug use: No   Sexual activity: Not on file  Other Topics Concern   Not on file  Social History Narrative   Not on file   Social Determinants of Health   Financial Resource Strain: Not on file  Food Insecurity: Not on file  Transportation Needs: Not on file  Physical Activity: Not on file  Stress: Not on file  Social Connections: Not on file  Intimate Partner Violence: Not on file    Outpatient Medications Prior to Visit  Medication Sig Dispense Refill   albuterol (VENTOLIN HFA) 108 (90 Base) MCG/ACT inhaler TAKE 2 PUFFS BY MOUTH EVERY 6 HOURS AS NEEDED FOR WHEEZE OR SHORTNESS OF BREATH 8.5 each 2   D 2000 50 MCG (2000 UT) TABS Take 1 tablet by mouth daily.     EPINEPHrine 0.3 mg/0.3 mL IJ SOAJ injection Inject 0.3 mg into the muscle as needed for anaphylaxis.     estradiol (ESTRACE) 1 MG tablet Take 1 mg by mouth daily.     fexofenadine (  ALLEGRA) 180 MG tablet Take 180 mg by mouth daily.     Multiple Vitamin (MULTIVITAMIN) tablet Take 1 tablet by mouth daily.     pantoprazole (PROTONIX) 40 MG tablet Take 1 tablet (40 mg total) by mouth 2 (two) times daily. 180 tablet 1   progesterone (PROMETRIUM) 100 MG capsule progesterone micronized 100 mg capsule     scopolamine (TRANSDERM-SCOP, 1.5 MG,) 1 MG/3DAYS Place 1 patch (1.5 mg total) onto the skin every 3 (three) days. 10 patch 12   No facility-administered medications prior to visit.    Allergies  Allergen Reactions   Honey Bee Venom Protein [Bee Venom] Anaphylaxis   Grass Pollen(K-O-R-T-Swt Vern) Cough, Other (See Comments) and Rash   Erythromycin Rash    Review of Systems See pertinent positives and negatives per HPI.    Objective:    Physical Exam Vitals and nursing note reviewed.  Constitutional:      General: She is not in acute  distress.    Appearance: Normal appearance.  HENT:     Head: Normocephalic.  Eyes:     Conjunctiva/sclera: Conjunctivae normal.  Cardiovascular:     Rate and Rhythm: Normal rate and regular rhythm.     Pulses: Normal pulses.     Heart sounds: Normal heart sounds.  Pulmonary:     Effort: Pulmonary effort is normal.     Breath sounds: Normal breath sounds.  Musculoskeletal:     Cervical back: Normal range of motion.  Skin:    General: Skin is warm.  Neurological:     General: No focal deficit present.     Mental Status: She is alert and oriented to person, place, and time.  Psychiatric:        Mood and Affect: Mood normal.        Behavior: Behavior normal.        Thought Content: Thought content normal.        Judgment: Judgment normal.    BP 136/86    Pulse 93    Temp (!) 96.6 F (35.9 C) (Temporal)    Wt 176 lb 6.4 oz (80 kg)    SpO2 99%    BMI 31.25 kg/m  Wt Readings from Last 3 Encounters:  11/09/21 176 lb 6.4 oz (80 kg)  06/28/21 169 lb 11.2 oz (77 kg)  02/07/21 171 lb 9.6 oz (77.8 kg)    Health Maintenance Due  Topic Date Due   HIV Screening  Never done   Hepatitis C Screening  Never done   COVID-19 Vaccine (5 - Booster for Pfizer series) 06/15/2021    There are no preventive care reminders to display for this patient.   Lab Results  Component Value Date   TSH 1.25 06/28/2021   Lab Results  Component Value Date   WBC 5.7 06/28/2021   HGB 13.7 06/28/2021   HCT 41.2 06/28/2021   MCV 96.0 06/28/2021   PLT 335.0 06/28/2021   Lab Results  Component Value Date   NA 139 06/28/2021   K 4.1 06/28/2021   CO2 29 06/28/2021   GLUCOSE 84 06/28/2021   BUN 17 06/28/2021   CREATININE 0.74 06/28/2021   BILITOT 0.4 06/28/2021   ALKPHOS 103 06/28/2021   AST 17 06/28/2021   ALT 14 06/28/2021   PROT 6.2 06/28/2021   ALBUMIN 3.7 06/28/2021   CALCIUM 9.1 06/28/2021   ANIONGAP 8 02/07/2017   GFR 92.44 06/28/2021   Lab Results  Component Value Date   CHOL 137  06/28/2021  Lab Results  Component Value Date   HDL 67.50 06/28/2021   Lab Results  Component Value Date   LDLCALC 42 06/28/2021   Lab Results  Component Value Date   TRIG 138.0 06/28/2021   Lab Results  Component Value Date   CHOLHDL 2 06/28/2021   Lab Results  Component Value Date   HGBA1C 5.4 06/28/2021       Assessment & Plan:   Problem List Items Addressed This Visit       Respiratory   Moderate persistent asthma with exacerbation - Primary    Exacerbation of asthma symptoms with exposure to cleaning chemicals at work. Lungs clear with no wheezing during exam and oxygen saturation 99% today. She is able to talk in complete sentences. Will treat with solumedrol 40mg  IM x1 in office and prednisone taper to start tomorrow. Will also send in albuterol nebulizer since she has a machine at home. She declined a nebulizer in the office today. She can also take benadryl every 6-8 hours as needed, may cause sedation. Discussed strict ER precautions. Follow up with any concerns.       Relevant Medications   albuterol (PROVENTIL) (2.5 MG/3ML) 0.083% nebulizer solution   predniSONE (DELTASONE) 10 MG tablet     Meds ordered this encounter  Medications   albuterol (PROVENTIL) (2.5 MG/3ML) 0.083% nebulizer solution    Sig: Take 3 mLs (2.5 mg total) by nebulization every 6 (six) hours as needed for wheezing or shortness of breath.    Dispense:  150 mL    Refill:  1   methylPREDNISolone sodium succinate (SOLU-MEDROL) 40 mg/mL injection 40 mg   predniSONE (DELTASONE) 10 MG tablet    Sig: Take 6 tablets today, then 5 tablets tomorrow, then decrease by 1 tablet every day until gone    Dispense:  21 tablet    Refill:  0     Charyl Dancer, NP

## 2021-11-09 NOTE — Patient Instructions (Signed)
It was great to see you!  We are giving you a steroid injection in office today. Start prednsione taper tomorrow morning with food. I have sent albuterol nebulizer in to your pharmacy. Use this every 4 hours for the next 2 days, then every 4 hours as needed. You can also take benadryl every 6-8 hours as needed. If your symptoms don't improve or worsen, go to urgent care or the ER.    Take care,  Vance Peper, NP

## 2021-11-09 NOTE — Assessment & Plan Note (Signed)
Exacerbation of asthma symptoms with exposure to cleaning chemicals at work. Lungs clear with no wheezing during exam and oxygen saturation 99% today. She is able to talk in complete sentences. Will treat with solumedrol 40mg  IM x1 in office and prednisone taper to start tomorrow. Will also send in albuterol nebulizer since she has a machine at home. She declined a nebulizer in the office today. She can also take benadryl every 6-8 hours as needed, may cause sedation. Discussed strict ER precautions. Follow up with any concerns.

## 2021-11-20 ENCOUNTER — Other Ambulatory Visit: Payer: Self-pay | Admitting: *Deleted

## 2021-11-20 DIAGNOSIS — K219 Gastro-esophageal reflux disease without esophagitis: Secondary | ICD-10-CM

## 2021-11-20 MED ORDER — PANTOPRAZOLE SODIUM 40 MG PO TBEC: 40.0000 mg | DELAYED_RELEASE_TABLET | Freq: Two times a day (BID) | ORAL | 1 refills | Status: DC

## 2021-12-16 ENCOUNTER — Other Ambulatory Visit: Payer: Self-pay | Admitting: Internal Medicine

## 2021-12-16 DIAGNOSIS — K219 Gastro-esophageal reflux disease without esophagitis: Secondary | ICD-10-CM

## 2022-03-31 ENCOUNTER — Other Ambulatory Visit: Payer: Self-pay | Admitting: Internal Medicine

## 2022-03-31 DIAGNOSIS — K219 Gastro-esophageal reflux disease without esophagitis: Secondary | ICD-10-CM

## 2022-05-20 DIAGNOSIS — R319 Hematuria, unspecified: Secondary | ICD-10-CM | POA: Diagnosis not present

## 2022-05-20 DIAGNOSIS — Z124 Encounter for screening for malignant neoplasm of cervix: Secondary | ICD-10-CM | POA: Diagnosis not present

## 2022-05-20 DIAGNOSIS — Z1151 Encounter for screening for human papillomavirus (HPV): Secondary | ICD-10-CM | POA: Diagnosis not present

## 2022-05-20 DIAGNOSIS — Z1231 Encounter for screening mammogram for malignant neoplasm of breast: Secondary | ICD-10-CM | POA: Diagnosis not present

## 2022-05-20 DIAGNOSIS — Z01419 Encounter for gynecological examination (general) (routine) without abnormal findings: Secondary | ICD-10-CM | POA: Diagnosis not present

## 2022-05-20 DIAGNOSIS — Z6831 Body mass index (BMI) 31.0-31.9, adult: Secondary | ICD-10-CM | POA: Diagnosis not present

## 2022-05-23 ENCOUNTER — Other Ambulatory Visit: Payer: Self-pay | Admitting: Obstetrics and Gynecology

## 2022-05-23 DIAGNOSIS — R928 Other abnormal and inconclusive findings on diagnostic imaging of breast: Secondary | ICD-10-CM

## 2022-05-26 DIAGNOSIS — R21 Rash and other nonspecific skin eruption: Secondary | ICD-10-CM | POA: Diagnosis not present

## 2022-05-30 DIAGNOSIS — N852 Hypertrophy of uterus: Secondary | ICD-10-CM | POA: Diagnosis not present

## 2022-06-04 ENCOUNTER — Ambulatory Visit
Admission: RE | Admit: 2022-06-04 | Discharge: 2022-06-04 | Disposition: A | Discharge status: Home / Self Care | Payer: BC Managed Care – PPO | Source: Ambulatory Visit | Attending: Obstetrics and Gynecology | Admitting: Obstetrics and Gynecology

## 2022-06-04 DIAGNOSIS — R928 Other abnormal and inconclusive findings on diagnostic imaging of breast: Secondary | ICD-10-CM

## 2022-06-04 DIAGNOSIS — N6001 Solitary cyst of right breast: Secondary | ICD-10-CM | POA: Diagnosis not present

## 2022-06-04 DIAGNOSIS — R922 Inconclusive mammogram: Secondary | ICD-10-CM | POA: Diagnosis not present

## 2022-07-01 DIAGNOSIS — N882 Stricture and stenosis of cervix uteri: Secondary | ICD-10-CM | POA: Diagnosis not present

## 2022-07-09 ENCOUNTER — Encounter: Payer: Self-pay | Admitting: Internal Medicine

## 2022-07-09 DIAGNOSIS — T753XXD Motion sickness, subsequent encounter: Secondary | ICD-10-CM

## 2022-07-09 MED ORDER — SCOPOLAMINE 1 MG/3DAYS TD PT72: 1.0000 | MEDICATED_PATCH | TRANSDERMAL | 12 refills | Status: DC

## 2022-07-17 ENCOUNTER — Encounter: Payer: BC Managed Care – PPO | Admitting: Internal Medicine

## 2022-08-22 ENCOUNTER — Telehealth: Payer: Self-pay | Admitting: *Deleted

## 2022-08-22 NOTE — Telephone Encounter (Signed)
Prior Monique Wang has been started for  [employer] Key: [id number]

## 2022-08-26 NOTE — Telephone Encounter (Signed)
New information sent to [facility] Key: [id number]

## 2022-08-29 DIAGNOSIS — Z9889 Other specified postprocedural states: Secondary | ICD-10-CM | POA: Diagnosis not present

## 2022-08-29 DIAGNOSIS — Z79890 Hormone replacement therapy (postmenopausal): Secondary | ICD-10-CM | POA: Diagnosis not present

## 2022-08-29 DIAGNOSIS — N882 Stricture and stenosis of cervix uteri: Secondary | ICD-10-CM | POA: Diagnosis not present

## 2022-09-02 NOTE — Telephone Encounter (Signed)
PA was approved 08/26/22 - 08/27/23

## 2022-09-11 ENCOUNTER — Emergency Department (HOSPITAL_BASED_OUTPATIENT_CLINIC_OR_DEPARTMENT_OTHER): Payer: BC Managed Care – PPO

## 2022-09-11 ENCOUNTER — Emergency Department (HOSPITAL_BASED_OUTPATIENT_CLINIC_OR_DEPARTMENT_OTHER)
Admission: EM | Admit: 2022-09-11 | Discharge: 2022-09-11 | Disposition: A | Discharge status: Home / Self Care | Payer: BC Managed Care – PPO | Attending: Emergency Medicine | Admitting: Emergency Medicine

## 2022-09-11 ENCOUNTER — Other Ambulatory Visit: Payer: Self-pay

## 2022-09-11 ENCOUNTER — Encounter (HOSPITAL_BASED_OUTPATIENT_CLINIC_OR_DEPARTMENT_OTHER): Payer: Self-pay

## 2022-09-11 DIAGNOSIS — R059 Cough, unspecified: Secondary | ICD-10-CM | POA: Diagnosis not present

## 2022-09-11 DIAGNOSIS — R509 Fever, unspecified: Secondary | ICD-10-CM | POA: Diagnosis not present

## 2022-09-11 DIAGNOSIS — J111 Influenza due to unidentified influenza virus with other respiratory manifestations: Secondary | ICD-10-CM | POA: Insufficient documentation

## 2022-09-11 DIAGNOSIS — J101 Influenza due to other identified influenza virus with other respiratory manifestations: Secondary | ICD-10-CM | POA: Diagnosis not present

## 2022-09-11 DIAGNOSIS — Z20822 Contact with and (suspected) exposure to covid-19: Secondary | ICD-10-CM | POA: Insufficient documentation

## 2022-09-11 DIAGNOSIS — R0602 Shortness of breath: Secondary | ICD-10-CM | POA: Diagnosis not present

## 2022-09-11 LAB — RESP PANEL BY RT-PCR (RSV, FLU A&B, COVID)  RVPGX2
Influenza A by PCR: POSITIVE — AB
Influenza B by PCR: NEGATIVE
Resp Syncytial Virus by PCR: NEGATIVE
SARS Coronavirus 2 by RT PCR: NEGATIVE

## 2022-09-11 MED ORDER — OSELTAMIVIR PHOSPHATE 75 MG PO CAPS: 75.0000 mg | ORAL_CAPSULE | Freq: Two times a day (BID) | ORAL | 0 refills | Status: AC

## 2022-09-11 NOTE — ED Notes (Signed)
Discharge instructions reviewed with patient. Patient verbalizes understanding, no further questions at this time. Medications/prescriptions and follow up information provided. No acute distress noted at time of departure.  

## 2022-09-11 NOTE — ED Provider Notes (Signed)
[facility] EMERGENCY DEPARTMENT Provider Note   CSN: [id number] Arrival date & time: 09/11/22  6720     History  Chief Complaint  Patient presents with   Cough    Monique Wang is a 56 y.o. female presenting with chest pain, sob, muscles aches, starting 4 days ago on Sunday.  Reports she began having cough and fever yesterday.  No diarrhea or vomiting.  No sick contacts.  No sig medical issues - reports she had "covid pneumonia" two years ago.  Denies baseline respiratory health problems.  HPI     Home Medications Prior to Admission medications   Medication Sig Start Date End Date Taking? Authorizing Provider  oseltamivir (TAMIFLU) 75 MG capsule Take 1 capsule (75 mg total) by mouth every 12 (twelve) hours for 5 days. 09/11/22 09/16/22 Yes Jaimin Krupka, Carola Rhine, MD  albuterol (PROVENTIL) (2.5 MG/3ML) 0.083% nebulizer solution Take 3 mLs (2.5 mg total) by nebulization every 6 (six) hours as needed for wheezing or shortness of breath. 11/09/21   McElwee, Lauren A, NP  albuterol (VENTOLIN HFA) 108 (90 Base) MCG/ACT inhaler TAKE 2 PUFFS BY MOUTH EVERY 6 HOURS AS NEEDED FOR WHEEZE OR SHORTNESS OF BREATH 11/07/21   Isaac Bliss, Rayford Halsted, MD  D 2000 50 MCG (2000 UT) TABS Take 1 tablet by mouth daily. 01/10/20   [provider]  EPINEPHrine 0.3 mg/0.3 mL IJ SOAJ injection Inject 0.3 mg into the muscle as needed for anaphylaxis.    [provider]  estradiol (ESTRACE) 1 MG tablet Take 1 mg by mouth daily. 12/10/19   [provider]  fexofenadine (ALLEGRA) 180 MG tablet Take 180 mg by mouth daily.    [provider]  Multiple Vitamin (MULTIVITAMIN) tablet Take 1 tablet by mouth daily.    [provider]  pantoprazole (PROTONIX) 40 MG tablet TAKE 1 TABLET BY MOUTH TWICE  DAILY 04/01/22   Isaac Bliss, Rayford Halsted, MD  predniSONE (DELTASONE) 10 MG tablet Take 6 tablets today, then 5 tablets tomorrow, then decrease by 1 tablet every day  until gone 11/09/21   McElwee, Lauren A, NP  progesterone (PROMETRIUM) 100 MG capsule progesterone micronized 100 mg capsule 03/24/19   [provider]  scopolamine (TRANSDERM-SCOP, 1.5 MG,) 1 MG/3DAYS Place 1 patch (1.5 mg total) onto the skin every 3 (three) days. 07/09/22   Isaac Bliss, Rayford Halsted, MD      Allergies    Honey bee venom protein [bee venom], Grass pollen([id number]-swt vern), and Erythromycin    Review of Systems   Review of Systems  Physical Exam Updated Vital Signs BP (!) 140/83   Pulse 100   Temp 99.7 F (37.6 C) (Oral)   Resp 16   Ht '5\' 3"'$  (1.6 m)   Wt 78.9 kg   SpO2 95%   BMI 30.82 kg/m  Physical Exam Constitutional:      General: She is not in acute distress. HENT:     Head: Normocephalic and atraumatic.  Eyes:     Conjunctiva/sclera: Conjunctivae normal.     Pupils: Pupils are equal, round, and reactive to light.  Cardiovascular:     Rate and Rhythm: Normal rate and regular rhythm.  Pulmonary:     Effort: Pulmonary effort is normal. No respiratory distress.     Comments: Dry cough Abdominal:     General: There is no distension.     Tenderness: There is no abdominal tenderness.  Skin:    General: Skin is warm and dry.  Neurological:     General: No focal deficit present.     Mental Status: She is alert. Mental status is at baseline.  Psychiatric:        Mood and Affect: Mood normal.        Behavior: Behavior normal.     ED Results / Procedures / Treatments   Labs (all labs ordered are listed, but only abnormal results are displayed) Labs Reviewed  RESP PANEL BY RT-PCR (RSV, FLU A&B, COVID)  RVPGX2 - Abnormal; Notable for the following components:      Result Value   Influenza A by PCR POSITIVE (*)    All other components within normal limits    EKG None  Radiology DG Chest Port 1 View  Result Date: 09/11/2022 CLINICAL DATA:  Cough.  Fever and shortness of breath EXAM: PORTABLE CHEST 1 VIEW COMPARISON:  Chest CT 09/05/2020  FINDINGS: Normal heart size and mediastinal contours. No acute infiltrate or edema. No effusion or pneumothorax. No acute osseous findings. IMPRESSION: No active disease. Electronically Signed   By: Jorje Guild M.D.   On: 09/11/2022 07:54    Procedures Procedures    Medications Ordered in ED Medications - No data to display  ED Course/ Medical Decision Making/ A&P                           Medical Decision Making Amount and/or Complexity of Data Reviewed Radiology: ordered.  Risk Prescription drug management.   Suspect viral syndrome Pt will f/u on covid/flu/rsv testing at home Tamiflu provided if positive for flu -she feels rather certain that her symptoms began yesterday.  We discussed potential side effects of Tamiflu including GI upset, but she says she was on Tamiflu in the past and experienced an of the side effects, and feels that it helps shorten the duration of her illness.  I personally reviewed and interpreted her x-ray showing no focal infiltrate or abnormalities  The patient is not hypoxic does not appear clinically dehydrated.  I do not see an indication for medical admission at this time.  There is no indication for antibiotics.  I recommended supportive care and quarantine measures at home.  She verbalized understanding  *  Update, prior to discharge the patient was informed that her influenza test did come back positive.  *  [name] was evaluated in Emergency Department on 09/11/2022 for the symptoms described in the history of present illness. She was evaluated in the context of the global COVID-19 pandemic, which necessitated consideration that the patient might be at risk for infection with the SARS-CoV-2 virus that causes COVID-19. Institutional protocols and algorithms that pertain to the evaluation of patients at risk for COVID-19 are in a state of rapid change based on information released by regulatory bodies including the CDC and federal and state  organizations. These policies and algorithms were followed during the patient's care in the ED.         Final Clinical Impression(s) / ED Diagnoses Final diagnoses:  Influenza    Rx / DC Orders ED Discharge Orders          Ordered    oseltamivir (TAMIFLU) 75 MG capsule  Every 12 hours        09/11/22 0743              Wyvonnia Dusky, MD 09/11/22 [phone]

## 2022-09-11 NOTE — ED Triage Notes (Signed)
Complaining of a cough, fever, chills, and body aches since Sunday and has gotten worse. Did start using breathing treatments at home for shortness of breath with some improvement with air movement. She also started taking amoxicillin which she had left over from a previous infection.

## 2022-09-24 ENCOUNTER — Encounter: Payer: Self-pay | Admitting: Internal Medicine

## 2022-09-24 DIAGNOSIS — R051 Acute cough: Secondary | ICD-10-CM | POA: Diagnosis not present

## 2022-09-30 ENCOUNTER — Encounter: Payer: BC Managed Care – PPO | Admitting: Internal Medicine

## 2022-10-03 DIAGNOSIS — N814 Uterovaginal prolapse, unspecified: Secondary | ICD-10-CM | POA: Diagnosis not present

## 2022-10-03 DIAGNOSIS — N882 Stricture and stenosis of cervix uteri: Secondary | ICD-10-CM | POA: Diagnosis not present

## 2022-10-03 DIAGNOSIS — N859 Noninflammatory disorder of uterus, unspecified: Secondary | ICD-10-CM | POA: Diagnosis not present

## 2022-10-03 DIAGNOSIS — N952 Postmenopausal atrophic vaginitis: Secondary | ICD-10-CM | POA: Diagnosis not present

## 2022-10-03 DIAGNOSIS — N888 Other specified noninflammatory disorders of cervix uteri: Secondary | ICD-10-CM | POA: Diagnosis not present

## 2022-10-18 DIAGNOSIS — M25512 Pain in left shoulder: Secondary | ICD-10-CM | POA: Diagnosis not present

## 2022-10-18 DIAGNOSIS — M542 Cervicalgia: Secondary | ICD-10-CM | POA: Diagnosis not present

## 2022-10-22 ENCOUNTER — Other Ambulatory Visit: Payer: Self-pay | Admitting: Internal Medicine

## 2022-10-22 DIAGNOSIS — K219 Gastro-esophageal reflux disease without esophagitis: Secondary | ICD-10-CM

## 2022-11-23 ENCOUNTER — Other Ambulatory Visit: Payer: Self-pay | Admitting: Internal Medicine

## 2022-11-23 DIAGNOSIS — K219 Gastro-esophageal reflux disease without esophagitis: Secondary | ICD-10-CM

## 2022-12-31 ENCOUNTER — Encounter: Payer: Self-pay | Admitting: Dermatology

## 2022-12-31 ENCOUNTER — Ambulatory Visit (INDEPENDENT_AMBULATORY_CARE_PROVIDER_SITE_OTHER): Payer: BC Managed Care – PPO | Admitting: Dermatology

## 2022-12-31 VITALS — BP 119/81 | HR 78

## 2022-12-31 DIAGNOSIS — X32XXXA Exposure to sunlight, initial encounter: Secondary | ICD-10-CM

## 2022-12-31 DIAGNOSIS — Z1283 Encounter for screening for malignant neoplasm of skin: Secondary | ICD-10-CM | POA: Diagnosis not present

## 2022-12-31 DIAGNOSIS — D2371 Other benign neoplasm of skin of right lower limb, including hip: Secondary | ICD-10-CM

## 2022-12-31 DIAGNOSIS — D1801 Hemangioma of skin and subcutaneous tissue: Secondary | ICD-10-CM

## 2022-12-31 DIAGNOSIS — L821 Other seborrheic keratosis: Secondary | ICD-10-CM | POA: Diagnosis not present

## 2022-12-31 DIAGNOSIS — W908XXA Exposure to other nonionizing radiation, initial encounter: Secondary | ICD-10-CM

## 2022-12-31 DIAGNOSIS — L720 Epidermal cyst: Secondary | ICD-10-CM

## 2022-12-31 DIAGNOSIS — L578 Other skin changes due to chronic exposure to nonionizing radiation: Secondary | ICD-10-CM

## 2022-12-31 DIAGNOSIS — D2272 Melanocytic nevi of left lower limb, including hip: Secondary | ICD-10-CM

## 2022-12-31 DIAGNOSIS — L814 Other melanin hyperpigmentation: Secondary | ICD-10-CM

## 2022-12-31 DIAGNOSIS — L309 Dermatitis, unspecified: Secondary | ICD-10-CM

## 2022-12-31 DIAGNOSIS — Z86018 Personal history of other benign neoplasm: Secondary | ICD-10-CM

## 2022-12-31 DIAGNOSIS — D492 Neoplasm of unspecified behavior of bone, soft tissue, and skin: Secondary | ICD-10-CM

## 2022-12-31 MED ORDER — TRIAMCINOLONE ACETONIDE 0.1 % EX CREA: TOPICAL_CREAM | CUTANEOUS | 2 refills | Status: AC

## 2022-12-31 NOTE — Progress Notes (Signed)
New Patient Visit   Subjective  Monique Wang is a 57 y.o. female who presents for the following: Skin Cancer Screening and Full Body Skin Exam. Hx of actinic damage at nose. Has used 5FU in past, prescribed by Dr. Emily Filbert. Hx of dysplastic nevus on back. Removed several years ago by Dr. Emily Filbert.   The patient presents for Total-Body Skin Exam (TBSE) for skin cancer screening and mole check. The patient has spots, moles and lesions to be evaluated, some may be new or changing and the patient has concerns that these could be cancer.    The following portions of the chart were reviewed this encounter and updated as appropriate: medications, allergies, medical history  Review of Systems:  No other skin or systemic complaints except as noted in HPI or Assessment and Plan.  Objective  Well appearing patient in no apparent distress; mood and affect are within normal limits.  A full examination was performed including scalp, head, eyes, ears, nose, lips, neck, chest, axillae, abdomen, back, buttocks, bilateral upper extremities, bilateral lower extremities, hands, feet, fingers, toes, fingernails, and toenails. All findings within normal limits unless otherwise noted below.   Relevant physical exam findings are noted in the Assessment and Plan.  Left lateral thigh 4 mm irregular brown papule        Assessment & Plan   HISTORY OF DYSPLASTIC NEVUS. Back.  No evidence of recurrence today Recommend regular full body skin exams Recommend daily broad spectrum sunscreen SPF 30+ to sun-exposed areas, reapply every 2 hours as needed.  Call if any new or changing lesions are noted between office visits   LENTIGINES, SEBORRHEIC KERATOSES, HEMANGIOMAS - Benign normal skin lesions - Benign-appearing - Call for any changes  MELANOCYTIC NEVI - Tan-brown and/or pink-flesh-colored symmetric macules and papules - Benign appearing on exam today - Observation - Call clinic for new or changing  moles - Recommend daily use of broad spectrum spf 30+ sunscreen to sun-exposed areas.   ACTINIC DAMAGE - Chronic condition, secondary to cumulative UV/sun exposure - diffuse scaly erythematous macules with underlying dyspigmentation - Recommend daily broad spectrum sunscreen SPF 30+ to sun-exposed areas, reapply every 2 hours as needed.  - Staying in the shade or wearing long sleeves, sun glasses (UVA+UVB protection) and wide brim hats (4-inch brim around the entire circumference of the hat) are also recommended for sun protection.  - Call for new or changing lesions.  Milia - tiny firm white papules on face - type of cyst - benign - may be extracted if symptomatic - observe   DERMATOFIBROMA Right leg Exam: Firm pink/brown papulenodule with dimple sign. Treatment Plan: Benign-appearing.  Observation.  Call clinic for new or changing lesions.    Eczema Exam: Clear today at arms. Flares with mowing grass  Chronic condition with duration or expected duration over one year. Currently well-controlled.   Atopic dermatitis (eczema) is a chronic, relapsing, pruritic condition that can significantly affect quality of life. It is often associated with allergic rhinitis and/or asthma and can require treatment with topical medications, phototherapy, or in severe cases biologic injectable medication (Dupixent; Adbry) or Oral JAK inhibitors.  Treatment Plan: Apply triamcinolone 0.1% cream twice a day as needed for rash to affected areas up to 2 weeks. Avoid applying to face, groin, and axilla. Use as directed. Long-term use can cause thinning of the skin.  Topical steroids (such as triamcinolone, fluocinolone, fluocinonide, mometasone, clobetasol, halobetasol, betamethasone, hydrocortisone) can cause thinning and lightening of the skin if they  are used for too long in the same area. Your physician has selected the right strength medicine for your problem and area affected on the body. Please use  your medication only as directed by your physician to prevent side effects.    Recommend gentle skin care.   SKIN CANCER SCREENING PERFORMED TODAY.    Neoplasm of skin Left lateral thigh  Epidermal / dermal shaving  Lesion diameter (cm):  0.4 Informed consent: discussed and consent obtained   Timeout: patient name, date of birth, surgical site, and procedure verified   Procedure prep:  Patient was prepped and draped in usual sterile fashion Prep type:  Isopropyl alcohol Anesthesia: the lesion was anesthetized in a standard fashion   Anesthetic:  1% lidocaine w/ epinephrine 1-100,000 buffered w/ 8.4% NaHCO3 Instrument used: flexible razor blade   Hemostasis achieved with: pressure, aluminum chloride and electrodesiccation   Outcome: patient tolerated procedure well   Post-procedure details: sterile dressing applied and wound care instructions given   Dressing type: bandage and petrolatum      Return in about 1 year (around 12/31/2023) for TBSE.  I, Lawson Radar, CMA, am acting as scribe for Langston Reusing, MD.   Documentation: I have reviewed the above documentation for accuracy and completeness, and I agree with the above.  Langston Reusing, MD

## 2022-12-31 NOTE — Patient Instructions (Addendum)
Rash/Eczema Apply triamcinolone 0.1% cream twice a day as needed for rash to affected areas up to 2 weeks. Avoid applying to face, groin, and axilla. Use as directed. Long-term use can cause thinning of the skin.  Topical steroids (such as triamcinolone, fluocinolone, fluocinonide, mometasone, clobetasol, halobetasol, betamethasone, hydrocortisone) can cause thinning and lightening of the skin if they are used for too long in the same area. Your physician has selected the right strength medicine for your problem and area affected on the body. Please use your medication only as directed by your physician to prevent side effects.       Wound Care Instructions  Cleanse wound gently with soap and water once a day then pat dry with clean gauze. Apply a thin coat of Petrolatum (petroleum jelly, "Vaseline") over the wound (unless you have an allergy to this). We recommend that you use a new, sterile tube of Vaseline. Do not pick or remove scabs. Do not remove the yellow or white "healing tissue" from the base of the wound.  Cover the wound with fresh, clean, nonstick gauze and secure with paper tape. You may use Band-Aids in place of gauze and tape if the wound is small enough, but would recommend trimming much of the tape off as there is often too much. Sometimes Band-Aids can irritate the skin.  You should call the office for your biopsy report after 1 week if you have not already been contacted.  If you experience any problems, such as abnormal amounts of bleeding, swelling, significant bruising, significant pain, or evidence of infection, please call the office immediately.  FOR ADULT SURGERY PATIENTS: If you need something for pain relief you may take 1 extra strength Tylenol (acetaminophen) AND 2 Ibuprofen (200mg  each) together every 4 hours as needed for pain. (do not take these if you are allergic to them or if you have a reason you should not take them.) Typically, you may only need pain  medication for 1 to 3 days.      Gentle Skin Care Guide  1. Bathe no more than once a day.  2. Avoid bathing in hot water  3. Use a mild soap like Dove, Vanicream, Cetaphil, CeraVe. Can use Lever 2000 or Cetaphil antibacterial soap  4. Use soap only where you need it. On most days, use it under your arms, between your legs, and on your feet. Let the water rinse other areas unless visibly dirty.  5. When you get out of the bath/shower, use a towel to gently blot your skin dry, don't rub it.  6. While your skin is still a little damp, apply a moisturizing cream such as Vanicream, CeraVe, Cetaphil, Eucerin, Sarna lotion or plain Vaseline Jelly. For hands apply Neutrogena [place] Hand Cream or Excipial Hand Cream.  7. Reapply moisturizer any time you start to itch or feel dry.  8. Sometimes using free and clear laundry detergents can be helpful. Fabric softener sheets should be avoided. Downy Free & Gentle liquid, or any liquid fabric softener that is free of dyes and perfumes, it acceptable to use  9. If your doctor has given you prescription creams you may apply moisturizers over them      Recommend daily broad spectrum sunscreen SPF 30+ to sun-exposed areas, reapply every 2 hours as needed. Call for new or changing lesions.  Staying in the shade or wearing long sleeves, sun glasses (UVA+UVB protection) and wide brim hats (4-inch brim around the entire circumference of the hat) are also recommended  for sun protection.   Melanoma ABCDEs  Melanoma is the most dangerous type of skin cancer, and is the leading cause of death from skin disease.  You are more likely to develop melanoma if you: Have light-colored skin, light-colored eyes, or red or blond hair Spend a lot of time in the sun Tan regularly, either outdoors or in a tanning bed Have had blistering sunburns, especially during childhood Have a close family member who has had a melanoma Have atypical moles or large  birthmarks  Early detection of melanoma is key since treatment is typically straightforward and cure rates are extremely high if we catch it early.   The first sign of melanoma is often a change in a mole or a new dark spot.  The ABCDE system is a way of remembering the signs of melanoma.  A for asymmetry:  The two halves do not match. B for border:  The edges of the growth are irregular. C for color:  A mixture of colors are present instead of an even brown color. D for diameter:  Melanomas are usually (but not always) greater than 6mm - the size of a pencil eraser. E for evolution:  The spot keeps changing in size, shape, and color.  Please check your skin once per month between visits. You can use a small mirror in front and a large mirror behind you to keep an eye on the back side or your body.   If you see any new or changing lesions before your next follow-up, please call to schedule a visit.  Please continue daily skin protection including broad spectrum sunscreen SPF 30+ to sun-exposed areas, reapplying every 2 hours as needed when you're outdoors.   Staying in the shade or wearing long sleeves, sun glasses (UVA+UVB protection) and wide brim hats (4-inch brim around the entire circumference of the hat) are also recommended for sun protection.     Due to recent changes in healthcare laws, you may see results of your pathology and/or laboratory studies on MyChart before the doctors have had a chance to review them. We understand that in some cases there may be results that are confusing or concerning to you. Please understand that not all results are received at the same time and often the doctors may need to interpret multiple results in order to provide you with the best plan of care or course of treatment. Therefore, we ask that you please give us 2 business days to thoroughly review all your results before contacting the office for clarification. Should we see a critical lab result,  you will be contacted sooner.   If You Need Anything After Your Visit  If you have any questions or concerns for your doctor, please call our main line at [phone] If no one answers, please leave a voicemail as directed and we will return your call as soon as possible. Messages left after 4 pm will be answered the following business day.   You may also send us a message via MyChart. We typically respond to MyChart messages within 1-2 business days.  For prescription refills, please ask your pharmacy to contact our office. Our fax number is [phone].  If you have an urgent issue when the clinic is closed that cannot wait until the next business day, you can page your doctor at the number below.    Please note that while we do our best to be available for urgent issues outside of office hours, we are not available  24/7.   If you have an urgent issue and are unable to reach [place], you may choose to seek medical care at your doctor's office, retail clinic, urgent care center, or emergency room.  If you have a medical emergency, please immediately call 911 or go to the emergency department. In the event of inclement weather, please call our main line at [phone] for an update on the status of any delays or closures.  Dermatology Medication Tips: Please keep the boxes that topical medications come in in order to help keep track of the instructions about where and how to use these. Pharmacies typically print the medication instructions only on the boxes and not directly on the medication tubes.   If your medication is too expensive, please contact our office at [phone] or send [place] a message through MyChart.   We are unable to tell what your co-pay for medications will be in advance as this is different depending on your insurance coverage. However, we may be able to find a substitute medication at lower cost or fill out paperwork to get insurance to cover a needed medication.   If a  prior authorization is required to get your medication covered by your insurance company, please allow [place] 1-2 business days to complete this process.  Drug prices often vary depending on where the prescription is filled and some pharmacies may offer cheaper prices.  The website www.goodrx.com contains coupons for medications through different pharmacies. The prices here do not account for what the cost may be with help from insurance (it may be cheaper with your insurance), but the website can give you the price if you did not use any insurance.  - You can print the associated coupon and take it with your prescription to the pharmacy.  - You may also stop by our office during regular business hours and pick up a GoodRx coupon card.  - If you need your prescription sent electronically to a different pharmacy, notify our office through [facility] or by phone at [phone]

## 2023-01-06 ENCOUNTER — Encounter: Payer: Self-pay | Admitting: Internal Medicine

## 2023-01-06 ENCOUNTER — Ambulatory Visit (INDEPENDENT_AMBULATORY_CARE_PROVIDER_SITE_OTHER): Payer: BC Managed Care – PPO | Admitting: Internal Medicine

## 2023-01-06 VITALS — BP 110/78 | HR 67 | Temp 98.2°F | Ht 63.0 in | Wt 171.2 lb

## 2023-01-06 DIAGNOSIS — Z0000 Encounter for general adult medical examination without abnormal findings: Secondary | ICD-10-CM

## 2023-01-06 DIAGNOSIS — J849 Interstitial pulmonary disease, unspecified: Secondary | ICD-10-CM

## 2023-01-06 DIAGNOSIS — J4520 Mild intermittent asthma, uncomplicated: Secondary | ICD-10-CM | POA: Diagnosis not present

## 2023-01-06 LAB — COMPREHENSIVE METABOLIC PANEL
ALT: 14 U/L (ref 0–35)
AST: 19 U/L (ref 0–37)
Albumin: 3.8 g/dL (ref 3.5–5.2)
Alkaline Phosphatase: 97 U/L (ref 39–117)
BUN: 17 mg/dL (ref 6–23)
CO2: 26 mEq/L (ref 19–32)
Calcium: 8.9 mg/dL (ref 8.4–10.5)
Chloride: 101 mEq/L (ref 96–112)
Creatinine, Ser: 0.86 mg/dL (ref 0.40–1.20)
GFR: 76.36 mL/min (ref >60.00)
Glucose, Bld: 92 mg/dL (ref 70–99)
Potassium: 3.9 mEq/L (ref 3.5–5.1)
Sodium: 135 mEq/L (ref 135–145)
Total Bilirubin: 0.3 mg/dL (ref 0.2–1.2)
Total Protein: 6.4 g/dL (ref 6.0–8.3)

## 2023-01-06 LAB — CBC WITH DIFFERENTIAL/PLATELET
Basophils Absolute: 0 10*3/uL (ref 0.0–0.1)
Basophils Relative: 0.5 % (ref 0.0–3.0)
Eosinophils Absolute: 0.2 10*3/uL (ref 0.0–0.7)
Eosinophils Relative: 2.5 % (ref 0.0–5.0)
HCT: 41.4 % (ref 36.0–46.0)
Hemoglobin: 14 g/dL (ref 12.0–15.0)
Lymphocytes Relative: 17.1 % (ref 12.0–46.0)
Lymphs Abs: 1.5 10*3/uL (ref 0.7–4.0)
MCHC: 33.7 g/dL (ref 30.0–36.0)
MCV: 95.5 fl (ref 78.0–100.0)
Monocytes Absolute: 0.8 10*3/uL (ref 0.1–1.0)
Monocytes Relative: 9 % (ref 3.0–12.0)
Neutro Abs: 6.4 10*3/uL (ref 1.4–7.7)
Neutrophils Relative %: 70.9 % (ref 43.0–77.0)
Platelets: 316 10*3/uL (ref 150.0–400.0)
RBC: 4.34 Mil/uL (ref 3.87–5.11)
RDW: 12.7 % (ref 11.5–15.5)
WBC: 9.1 10*3/uL (ref 4.0–10.5)

## 2023-01-06 LAB — LIPID PANEL
Cholesterol: 151 mg/dL (ref 0–200)
HDL: 64 mg/dL (ref >39.00)
LDL Cholesterol: 60 mg/dL (ref 0–99)
NonHDL: 86.56
Total CHOL/HDL Ratio: 2
Triglycerides: 132 mg/dL (ref 0.0–149.0)
VLDL: 26.4 mg/dL (ref 0.0–40.0)

## 2023-01-06 LAB — TSH: TSH: 1.42 u[IU]/mL (ref 0.35–5.50)

## 2023-01-06 LAB — VITAMIN B12: Vitamin B-12: 797 pg/mL (ref 211–911)

## 2023-01-06 LAB — VITAMIN D 25 HYDROXY (VIT D DEFICIENCY, FRACTURES): VITD: 70.13 ng/mL (ref 30.00–100.00)

## 2023-01-06 NOTE — Progress Notes (Signed)
Established Patient Office Visit     CC/Reason for Visit: Annual preventive exam  HPI: Monique Wang is a 57 y.o. female who is coming in today for the above mentioned reasons. 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  She is doing well.  She has no acute concerns or complaints.  She has routine eye and dental care.  All immunizations are up-to-date.   Past Medical/Surgical History: Past Medical History:  Diagnosis Date   Allergy    environmental   Bronchiectasis    COVID-19 01/2020   Dysplastic nevus    Back   GERD (gastroesophageal reflux disease)    Pneumonia     Past Surgical History:  Procedure Laterality Date   CARPAL TUNNEL RELEASE Right    ganglion cyst removal   CESAREAN SECTION     HERNIA REPAIR     SHOULDER SURGERY     uterine ablation      Social History:  reports that she has never smoked. She has never used smokeless tobacco. She reports that she does not drink alcohol and does not use drugs.  Allergies: Allergies  Allergen Reactions   Honey Bee Venom Protein [Bee Venom] Anaphylaxis   Grass Pollen(K-O-R-T-Swt Vern) Cough, Other (See Comments) and Rash   Erythromycin Rash    Family History:  Family History  Problem Relation Age of Onset   Hypertension Mother    Anxiety disorder Mother    Arthritis Mother    Hyperlipidemia Father    Stroke Father    Colon polyps Father    Colon cancer Father    Cancer Father    Depression Father    Heart disease Father    Colon cancer Paternal Grandmother    Cancer Paternal Grandmother    Depression Maternal Grandfather    Heart disease Maternal Grandfather    Heart disease Maternal Grandmother    Hypertension Maternal Grandmother    Stroke Maternal Grandmother    Cancer Brother    Hearing loss Brother    Heart disease Maternal Uncle    Intellectual disability Brother    Esophageal cancer Neg Hx    Stomach cancer Neg Hx    Rectal  cancer Neg Hx      Current Outpatient Medications:    albuterol (PROVENTIL) (2.5 MG/3ML) 0.083% nebulizer solution, Take 3 mLs (2.5 mg total) by nebulization every 6 (six) hours as needed for wheezing or shortness of breath., Disp: 150 mL, Rfl: 1   albuterol (VENTOLIN HFA) 108 (90 Base) MCG/ACT inhaler, TAKE 2 PUFFS BY MOUTH EVERY 6 HOURS AS NEEDED FOR WHEEZE OR SHORTNESS OF BREATH, Disp: 8.5 each, Rfl: 2   D 2000 50 MCG (2000 UT) TABS, Take 1 tablet by mouth daily., Disp: , Rfl:    EPINEPHrine 0.3 mg/0.3 mL IJ SOAJ injection, Inject 0.3 mg into the muscle as needed for anaphylaxis., Disp: , Rfl:    estradiol (ESTRACE) 1 MG tablet, Take 1 mg by mouth daily., Disp: , Rfl:    Multiple Vitamin (MULTIVITAMIN) tablet, Take 1 tablet by mouth daily., Disp: , Rfl:    pantoprazole (PROTONIX) 40 MG tablet, TAKE 1 TABLET BY MOUTH TWICE  DAILY, Disp: 180 tablet, Rfl: 0   progesterone (PROMETRIUM) 100 MG capsule, progesterone micronized 100 mg capsule, Disp: , Rfl:    scopolamine (TRANSDERM-SCOP, 1.5 MG,) 1 MG/3DAYS, Place 1 patch (1.5 mg total) onto the skin every 3 (three) days., Disp: 10 patch, Rfl: 12  triamcinolone cream (KENALOG) 0.1 %, Apply twice a day as needed for rash to affected areas up to 2 weeks. Avoid applying to face, groin, and axilla., Disp: 80 g, Rfl: 2  Review of Systems:  Negative unless indicated in HPI.   Physical Exam: Vitals:   01/06/23 1303  BP: 110/78  Pulse: 67  Temp: 98.2 F (36.8 C)  TempSrc: Oral  SpO2: 100%  Weight: 171 lb 3.2 oz (77.7 kg)  Height: 5\' 3"  (1.6 m)    Body mass index is 30.33 kg/m.   Physical Exam Vitals reviewed.  Constitutional:      General: She is not in acute distress.    Appearance: Normal appearance. She is not ill-appearing, toxic-appearing or diaphoretic.  HENT:     Head: Normocephalic.     Right Ear: Tympanic membrane, ear canal and external ear normal. There is no impacted cerumen.     Left Ear: Tympanic membrane, ear canal  and external ear normal. There is no impacted cerumen.     Nose: Nose normal.     Mouth/Throat:     Mouth: Mucous membranes are moist.     Pharynx: Oropharynx is clear. No oropharyngeal exudate or posterior oropharyngeal erythema.  Eyes:     General: No scleral icterus.       Right eye: No discharge.        Left eye: No discharge.     Conjunctiva/sclera: Conjunctivae normal.     Pupils: Pupils are equal, round, and reactive to light.  Neck:     Vascular: No carotid bruit.  Cardiovascular:     Rate and Rhythm: Normal rate and regular rhythm.     Pulses: Normal pulses.     Heart sounds: Normal heart sounds.  Pulmonary:     Effort: Pulmonary effort is normal. No respiratory distress.     Breath sounds: Normal breath sounds.  Abdominal:     General: Abdomen is flat. Bowel sounds are normal.     Palpations: Abdomen is soft.  Musculoskeletal:        General: Normal range of motion.     Cervical back: Normal range of motion.  Skin:    General: Skin is warm and dry.  Neurological:     General: No focal deficit present.     Mental Status: She is alert and oriented to person, place, and time. Mental status is at baseline.  Psychiatric:        Mood and Affect: Mood normal.        Behavior: Behavior normal.        Thought Content: Thought content normal.        Judgment: Judgment normal.     Flowsheet Row Office Visit from 01/06/2023 in [facility] [place]  PHQ-9 Total Score 4        Impression and Plan:  Encounter for preventive health examination  ILD (interstitial lung disease) - Plan: CBC with Differential/Platelet, Comprehensive metabolic panel, Lipid panel, TSH, Vitamin B12, Vitamin D, 25-hydroxy  Mild intermittent asthma without complication   -Recommend routine eye and dental care. -Healthy lifestyle discussed in detail. -Labs to be updated today. -Prostate cancer screening: N/A Health Maintenance  Topic Date Due   HIV Screening  Never  done   Hepatitis C Screening: USPSTF Recommendation to screen - Ages 51-79 yo.  Never done   COVID-19 Vaccine (5 - 2023-24 season) 01/22/2023*   Pap Smear  06/24/2023*   Mammogram  01/06/2024*   Flu Shot  04/24/2023  Colon Cancer Screening  05/22/2030   DTaP/Tdap/Td vaccine (4 - Td or Tdap) 01/05/2033   Zoster (Shingles) Vaccine  Completed   HPV Vaccine  Aged Out  *Topic was postponed. The date shown is not the original due date.         Chaya Jan, MD [facility]

## 2023-01-06 NOTE — Progress Notes (Signed)
Hi Monique Wang  Dr. Onalee Hua reviewed your biopsy results and it showed the spot removed was benign (not cancerous).  No additional treatment is required.  The detailed report is available to view in MyChart.  Have a great day!  Kind Regards,  Dr. Kermit Balo Care Team

## 2023-02-12 ENCOUNTER — Other Ambulatory Visit: Payer: Self-pay | Admitting: Internal Medicine

## 2023-02-12 DIAGNOSIS — K219 Gastro-esophageal reflux disease without esophagitis: Secondary | ICD-10-CM

## 2023-02-13 ENCOUNTER — Other Ambulatory Visit (HOSPITAL_COMMUNITY): Payer: Self-pay

## 2023-02-13 ENCOUNTER — Other Ambulatory Visit: Payer: Self-pay

## 2023-02-13 MED ORDER — ESTRADIOL 1 MG PO TABS
1.0000 mg | ORAL_TABLET | Freq: Every day | ORAL | 0 refills | Status: DC
  Filled 2023-02-13: qty 90, 90d supply, fill #0

## 2023-02-13 MED ORDER — PROGESTERONE MICRONIZED 100 MG PO CAPS
100.0000 mg | ORAL_CAPSULE | Freq: Every day | ORAL | 0 refills | Status: DC
  Filled 2023-02-13: qty 90, 90d supply, fill #0

## 2023-02-13 MED ORDER — PANTOPRAZOLE SODIUM 40 MG PO TBEC
40.0000 mg | DELAYED_RELEASE_TABLET | Freq: Two times a day (BID) | ORAL | 0 refills | Status: DC
  Filled 2023-02-13: qty 180, 90d supply, fill #0

## 2023-02-25 ENCOUNTER — Encounter: Payer: BC Managed Care – PPO | Admitting: Internal Medicine

## 2023-04-04 ENCOUNTER — Other Ambulatory Visit: Payer: Self-pay | Admitting: Oncology

## 2023-04-04 ENCOUNTER — Other Ambulatory Visit (HOSPITAL_COMMUNITY)
Admission: RE | Admit: 2023-04-04 | Discharge: 2023-04-04 | Disposition: A | Discharge status: Home / Self Care | Payer: BC Managed Care – PPO | Source: Ambulatory Visit | Attending: Oncology | Admitting: Oncology

## 2023-04-04 DIAGNOSIS — Z006 Encounter for examination for normal comparison and control in clinical research program: Secondary | ICD-10-CM

## 2023-04-22 ENCOUNTER — Encounter: Payer: Self-pay | Admitting: Internal Medicine

## 2023-04-22 DIAGNOSIS — U071 COVID-19: Secondary | ICD-10-CM

## 2023-04-23 ENCOUNTER — Other Ambulatory Visit (HOSPITAL_COMMUNITY): Payer: Self-pay

## 2023-04-23 MED ORDER — ALBUTEROL SULFATE HFA 108 (90 BASE) MCG/ACT IN AERS
2.0000 | INHALATION_SPRAY | Freq: Four times a day (QID) | RESPIRATORY_TRACT | 2 refills | Status: DC | PRN
  Filled 2023-04-23: qty 6.7, 25d supply, fill #0
  Filled 2023-05-30: qty 6.7, 25d supply, fill #1

## 2023-04-28 ENCOUNTER — Encounter: Payer: Self-pay | Admitting: Internal Medicine

## 2023-04-28 ENCOUNTER — Ambulatory Visit (INDEPENDENT_AMBULATORY_CARE_PROVIDER_SITE_OTHER): Payer: 59 | Admitting: Internal Medicine

## 2023-04-28 ENCOUNTER — Other Ambulatory Visit (HOSPITAL_BASED_OUTPATIENT_CLINIC_OR_DEPARTMENT_OTHER): Payer: Self-pay

## 2023-04-28 ENCOUNTER — Ambulatory Visit (INDEPENDENT_AMBULATORY_CARE_PROVIDER_SITE_OTHER): Payer: BC Managed Care – PPO

## 2023-04-28 VITALS — BP 120/90 | HR 95 | Temp 98.2°F | Wt 168.2 lb

## 2023-04-28 DIAGNOSIS — Z8616 Personal history of COVID-19: Secondary | ICD-10-CM | POA: Diagnosis not present

## 2023-04-28 DIAGNOSIS — R053 Chronic cough: Secondary | ICD-10-CM

## 2023-04-28 DIAGNOSIS — R0602 Shortness of breath: Secondary | ICD-10-CM

## 2023-04-28 DIAGNOSIS — J849 Interstitial pulmonary disease, unspecified: Secondary | ICD-10-CM | POA: Diagnosis not present

## 2023-04-28 DIAGNOSIS — R059 Cough, unspecified: Secondary | ICD-10-CM | POA: Diagnosis not present

## 2023-04-28 MED ORDER — PREDNISONE 10 MG PO TABS
ORAL_TABLET | ORAL | 0 refills | Status: DC
  Filled 2023-04-28: qty 21, 6d supply, fill #0

## 2023-04-28 NOTE — Progress Notes (Signed)
Established Patient Office Visit     CC/Reason for Visit: Cough and shortness of breath  HPI: Monique Wang is a 57 y.o. female who is coming in today for the above mentioned reasons. Past Medical History is significant for: Post-COVID ILD.  She was feeling well until around July 20.  At that time she started developing some chest tightness and mild dyspnea on exertion.  She was exposed to COVID while at a funeral.  However she has taken 4 home COVID test that have been negative.  Her Apple Watch has been measuring her O2 saturations and there have been some levels down into the mid to high 80s.  In office O2 sats today are 99% on room air.   Past Medical/Surgical History: Past Medical History:  Diagnosis Date   Allergy    environmental   Bronchiectasis (HCC)    COVID-19 01/2020   Dysplastic nevus    Back   GERD (gastroesophageal reflux disease)    Pneumonia     Past Surgical History:  Procedure Laterality Date   CARPAL TUNNEL RELEASE Right    ganglion cyst removal   CESAREAN SECTION     HERNIA REPAIR     SHOULDER SURGERY     uterine ablation      Social History:  reports that she has never smoked. She has never used smokeless tobacco. She reports that she does not drink alcohol and does not use drugs.  Allergies: Allergies  Allergen Reactions   Honey Bee Venom Protein [Bee Venom] Anaphylaxis   Grass Pollen(K-O-R-T-Swt Vern) Cough, Other (See Comments) and Rash   Erythromycin Rash    Family History:  Family History  Problem Relation Age of Onset   Hypertension Mother    Anxiety disorder Mother    Arthritis Mother    Hyperlipidemia Father    Stroke Father    Colon polyps Father    Colon cancer Father    Cancer Father    Depression Father    Heart disease Father    Colon cancer Paternal Grandmother    Cancer Paternal Grandmother    Depression Maternal Grandfather    Heart disease Maternal Grandfather    Heart disease Maternal Grandmother     Hypertension Maternal Grandmother    Stroke Maternal Grandmother    Cancer Brother    Hearing loss Brother    Heart disease Maternal Uncle    Intellectual disability Brother    Esophageal cancer Neg Hx    Stomach cancer Neg Hx    Rectal cancer Neg Hx      Current Outpatient Medications:    albuterol (PROVENTIL) (2.5 MG/3ML) 0.083% nebulizer solution, Take 3 mLs (2.5 mg total) by nebulization every 6 (six) hours as needed for wheezing or shortness of breath., Disp: 150 mL, Rfl: 1   albuterol (VENTOLIN HFA) 108 (90 Base) MCG/ACT inhaler, Inhale 2 puffs into the lungs every 6 (six) hours as needed for wheezing or shortness of breath., Disp: 8.5 g, Rfl: 2   D 2000 50 MCG (2000 UT) TABS, Take 1 tablet by mouth daily., Disp: , Rfl:    EPINEPHrine 0.3 mg/0.3 mL IJ SOAJ injection, Inject 0.3 mg into the muscle as needed for anaphylaxis., Disp: , Rfl:    estradiol (ESTRACE) 1 MG tablet, Take 1 tablet (1 mg total) by mouth daily., Disp: 90 tablet, Rfl: 0   Multiple Vitamin (MULTIVITAMIN) tablet, Take 1 tablet by mouth daily., Disp: , Rfl:    pantoprazole (PROTONIX) 40 MG  tablet, Take 1 tablet (40 mg total) by mouth 2 (two) times daily., Disp: 180 tablet, Rfl: 0   predniSONE (STERAPRED UNI-PAK 21 TAB) 10 MG (21) TBPK tablet, Take as directed, Disp: 21 tablet, Rfl: 0   progesterone (PROMETRIUM) 100 MG capsule, Take 1 capsule (100 mg total) by mouth daily., Disp: 90 capsule, Rfl: 0   scopolamine (TRANSDERM-SCOP, 1.5 MG,) 1 MG/3DAYS, Place 1 patch (1.5 mg total) onto the skin every 3 (three) days., Disp: 10 patch, Rfl: 12   triamcinolone cream (KENALOG) 0.1 %, Apply twice a day as needed for rash to affected areas up to 2 weeks. Avoid applying to face, groin, and axilla., Disp: 80 g, Rfl: 2  Review of Systems:  Negative unless indicated in HPI.   Physical Exam: Vitals:   04/28/23 1610  BP: (!) 120/90  Pulse: 95  Temp: 98.2 F (36.8 C)  TempSrc: Oral  SpO2: 99%  Weight: 168 lb 3.2 oz (76.3 kg)     Body mass index is 29.8 kg/m.   Physical Exam Vitals reviewed.  Constitutional:      Appearance: Normal appearance.  HENT:     Head: Normocephalic and atraumatic.  Eyes:     Conjunctiva/sclera: Conjunctivae normal.     Pupils: Pupils are equal, round, and reactive to light.  Cardiovascular:     Rate and Rhythm: Normal rate and regular rhythm.  Pulmonary:     Effort: Pulmonary effort is normal.     Breath sounds: Normal breath sounds.  Skin:    General: Skin is warm and dry.  Neurological:     General: No focal deficit present.     Mental Status: She is alert and oriented to person, place, and time.  Psychiatric:        Mood and Affect: Mood normal.        Behavior: Behavior normal.        Thought Content: Thought content normal.        Judgment: Judgment normal.      Impression and Plan:  Chronic cough -     DG Chest 2 View; Future -     predniSONE; Take as directed  Dispense: 21 tablet; Refill: 0  SOB (shortness of breath)  ILD (interstitial lung disease) (HCC)   -Based on lung auscultation today and symptoms I do not think she has pneumonia.  Sats are within range today.  Check chest x-ray and will send a prednisone taper.  If she fails to improve we will consider further workup.  Time spent:32 minutes reviewing chart, interviewing and examining patient and formulating plan of care.     Chaya Jan, MD [facility]

## 2023-05-29 DIAGNOSIS — Z01419 Encounter for gynecological examination (general) (routine) without abnormal findings: Secondary | ICD-10-CM | POA: Diagnosis not present

## 2023-05-29 DIAGNOSIS — Z1231 Encounter for screening mammogram for malignant neoplasm of breast: Secondary | ICD-10-CM | POA: Diagnosis not present

## 2023-05-29 DIAGNOSIS — Z6831 Body mass index (BMI) 31.0-31.9, adult: Secondary | ICD-10-CM | POA: Diagnosis not present

## 2023-05-30 ENCOUNTER — Other Ambulatory Visit: Payer: Self-pay

## 2023-05-30 ENCOUNTER — Other Ambulatory Visit (HOSPITAL_COMMUNITY): Payer: Self-pay

## 2023-05-30 ENCOUNTER — Other Ambulatory Visit: Payer: Self-pay | Admitting: Internal Medicine

## 2023-05-30 DIAGNOSIS — K219 Gastro-esophageal reflux disease without esophagitis: Secondary | ICD-10-CM

## 2023-06-02 ENCOUNTER — Other Ambulatory Visit (HOSPITAL_COMMUNITY): Payer: Self-pay

## 2023-06-02 ENCOUNTER — Other Ambulatory Visit: Payer: Self-pay

## 2023-06-02 MED ORDER — PANTOPRAZOLE SODIUM 40 MG PO TBEC
40.0000 mg | DELAYED_RELEASE_TABLET | Freq: Two times a day (BID) | ORAL | 0 refills | Status: DC
  Filled 2023-06-02: qty 180, 90d supply, fill #0

## 2023-06-02 MED ORDER — ESTRADIOL 1 MG PO TABS
1.0000 mg | ORAL_TABLET | Freq: Every day | ORAL | 0 refills | Status: DC
  Filled 2023-06-02: qty 90, 90d supply, fill #0

## 2023-06-02 MED ORDER — PROGESTERONE MICRONIZED 100 MG PO CAPS
100.0000 mg | ORAL_CAPSULE | Freq: Every day | ORAL | 0 refills | Status: DC
  Filled 2023-06-02: qty 90, 90d supply, fill #0

## 2023-06-03 ENCOUNTER — Other Ambulatory Visit: Payer: Self-pay

## 2023-06-03 DIAGNOSIS — Z13820 Encounter for screening for osteoporosis: Secondary | ICD-10-CM | POA: Diagnosis not present

## 2023-08-25 ENCOUNTER — Other Ambulatory Visit: Payer: Self-pay | Admitting: Internal Medicine

## 2023-08-25 DIAGNOSIS — K219 Gastro-esophageal reflux disease without esophagitis: Secondary | ICD-10-CM

## 2023-08-25 MED ORDER — PROGESTERONE MICRONIZED 100 MG PO CAPS
100.0000 mg | ORAL_CAPSULE | Freq: Every day | ORAL | 0 refills | Status: DC
  Filled 2023-08-25 – 2023-08-26 (×2): qty 30, 30d supply, fill #0
  Filled 2023-09-27: qty 30, 30d supply, fill #1
  Filled 2023-11-17: qty 30, 30d supply, fill #2

## 2023-08-25 MED ORDER — ESTRADIOL 1 MG PO TABS
1.0000 mg | ORAL_TABLET | Freq: Every day | ORAL | 0 refills | Status: DC
  Filled 2023-08-25: qty 30, 30d supply, fill #0
  Filled 2023-09-27: qty 30, 30d supply, fill #1
  Filled 2023-11-17: qty 30, 30d supply, fill #2

## 2023-08-25 MED ORDER — PANTOPRAZOLE SODIUM 40 MG PO TBEC
40.0000 mg | DELAYED_RELEASE_TABLET | Freq: Two times a day (BID) | ORAL | 0 refills | Status: DC
  Filled 2023-08-25 – 2023-08-26 (×2): qty 60, 30d supply, fill #0
  Filled 2023-09-27: qty 60, 30d supply, fill #1
  Filled 2023-11-17: qty 60, 30d supply, fill #2

## 2023-08-26 ENCOUNTER — Other Ambulatory Visit (HOSPITAL_COMMUNITY): Payer: Self-pay

## 2023-08-26 ENCOUNTER — Other Ambulatory Visit: Payer: Self-pay

## 2023-08-28 ENCOUNTER — Other Ambulatory Visit (HOSPITAL_BASED_OUTPATIENT_CLINIC_OR_DEPARTMENT_OTHER): Payer: Self-pay

## 2023-10-06 ENCOUNTER — Encounter (HOSPITAL_COMMUNITY): Payer: Self-pay

## 2023-10-06 ENCOUNTER — Other Ambulatory Visit: Payer: Self-pay

## 2023-10-06 ENCOUNTER — Other Ambulatory Visit (HOSPITAL_COMMUNITY): Payer: Self-pay

## 2023-11-17 ENCOUNTER — Other Ambulatory Visit: Payer: Self-pay

## 2023-11-17 ENCOUNTER — Other Ambulatory Visit (HOSPITAL_COMMUNITY): Payer: Self-pay

## 2023-12-10 ENCOUNTER — Other Ambulatory Visit (HOSPITAL_BASED_OUTPATIENT_CLINIC_OR_DEPARTMENT_OTHER): Payer: Self-pay

## 2023-12-10 DIAGNOSIS — Z03818 Encounter for observation for suspected exposure to other biological agents ruled out: Secondary | ICD-10-CM | POA: Diagnosis not present

## 2023-12-10 DIAGNOSIS — J019 Acute sinusitis, unspecified: Secondary | ICD-10-CM | POA: Diagnosis not present

## 2023-12-10 DIAGNOSIS — R051 Acute cough: Secondary | ICD-10-CM | POA: Diagnosis not present

## 2023-12-10 MED ORDER — PREDNISONE 10 MG (21) PO TBPK
ORAL_TABLET | ORAL | 0 refills | Status: DC
  Filled 2023-12-10: qty 21, 6d supply, fill #0

## 2023-12-10 MED ORDER — AMOXICILLIN-POT CLAVULANATE 875-125 MG PO TABS
1.0000 | ORAL_TABLET | Freq: Two times a day (BID) | ORAL | 0 refills | Status: AC
  Filled 2023-12-10: qty 20, 10d supply, fill #0

## 2023-12-16 ENCOUNTER — Other Ambulatory Visit: Payer: Self-pay | Admitting: Internal Medicine

## 2023-12-16 ENCOUNTER — Other Ambulatory Visit (HOSPITAL_COMMUNITY): Payer: Self-pay

## 2023-12-16 DIAGNOSIS — K219 Gastro-esophageal reflux disease without esophagitis: Secondary | ICD-10-CM

## 2023-12-16 MED ORDER — PROGESTERONE MICRONIZED 100 MG PO CAPS
100.0000 mg | ORAL_CAPSULE | Freq: Every day | ORAL | 0 refills | Status: DC
  Filled 2023-12-16: qty 30, 30d supply, fill #0
  Filled 2024-01-16: qty 30, 30d supply, fill #1

## 2023-12-16 MED ORDER — ESTRADIOL 1 MG PO TABS
1.0000 mg | ORAL_TABLET | Freq: Every day | ORAL | 0 refills | Status: DC
  Filled 2023-12-16: qty 30, 30d supply, fill #0
  Filled 2024-01-16: qty 30, 30d supply, fill #1

## 2023-12-16 MED ORDER — PANTOPRAZOLE SODIUM 40 MG PO TBEC
40.0000 mg | DELAYED_RELEASE_TABLET | Freq: Two times a day (BID) | ORAL | 0 refills | Status: DC
  Filled 2023-12-16: qty 60, 30d supply, fill #0
  Filled 2024-01-16: qty 60, 30d supply, fill #1

## 2024-01-06 ENCOUNTER — Ambulatory Visit: Payer: BC Managed Care – PPO | Admitting: Dermatology

## 2024-01-14 ENCOUNTER — Encounter: Payer: BC Managed Care – PPO | Admitting: Internal Medicine

## 2024-01-16 ENCOUNTER — Other Ambulatory Visit (HOSPITAL_COMMUNITY): Payer: Self-pay

## 2024-01-27 ENCOUNTER — Other Ambulatory Visit (HOSPITAL_BASED_OUTPATIENT_CLINIC_OR_DEPARTMENT_OTHER): Payer: Self-pay

## 2024-01-27 ENCOUNTER — Encounter: Payer: Self-pay | Admitting: Internal Medicine

## 2024-01-27 ENCOUNTER — Ambulatory Visit (INDEPENDENT_AMBULATORY_CARE_PROVIDER_SITE_OTHER): Payer: BC Managed Care – PPO | Admitting: Internal Medicine

## 2024-01-27 VITALS — BP 120/80 | HR 76 | Temp 98.1°F | Ht 63.0 in | Wt 163.0 lb

## 2024-01-27 DIAGNOSIS — Z114 Encounter for screening for human immunodeficiency virus [HIV]: Secondary | ICD-10-CM

## 2024-01-27 DIAGNOSIS — Z1159 Encounter for screening for other viral diseases: Secondary | ICD-10-CM | POA: Diagnosis not present

## 2024-01-27 DIAGNOSIS — K219 Gastro-esophageal reflux disease without esophagitis: Secondary | ICD-10-CM | POA: Diagnosis not present

## 2024-01-27 DIAGNOSIS — Z0000 Encounter for general adult medical examination without abnormal findings: Secondary | ICD-10-CM | POA: Diagnosis not present

## 2024-01-27 DIAGNOSIS — Z13220 Encounter for screening for lipoid disorders: Secondary | ICD-10-CM | POA: Diagnosis not present

## 2024-01-27 DIAGNOSIS — N951 Menopausal and female climacteric states: Secondary | ICD-10-CM

## 2024-01-27 LAB — COMPREHENSIVE METABOLIC PANEL WITH GFR
ALT: 15 U/L (ref 0–35)
AST: 18 U/L (ref 0–37)
Albumin: 4.1 g/dL (ref 3.5–5.2)
Alkaline Phosphatase: 96 U/L (ref 39–117)
BUN: 20 mg/dL (ref 6–23)
CO2: 28 meq/L (ref 19–32)
Calcium: 9.3 mg/dL (ref 8.4–10.5)
Chloride: 102 meq/L (ref 96–112)
Creatinine, Ser: 0.73 mg/dL (ref 0.40–1.20)
GFR: 92.27 mL/min (ref >60.00)
Glucose, Bld: 89 mg/dL (ref 70–99)
Potassium: 3.8 meq/L (ref 3.5–5.1)
Sodium: 138 meq/L (ref 135–145)
Total Bilirubin: 0.5 mg/dL (ref 0.2–1.2)
Total Protein: 6.8 g/dL (ref 6.0–8.3)

## 2024-01-27 LAB — CBC WITH DIFFERENTIAL/PLATELET
Basophils Absolute: 0.1 10*3/uL (ref 0.0–0.1)
Basophils Relative: 1 % (ref 0.0–3.0)
Eosinophils Absolute: 0.1 10*3/uL (ref 0.0–0.7)
Eosinophils Relative: 2.5 % (ref 0.0–5.0)
HCT: 43.9 % (ref 36.0–46.0)
Hemoglobin: 14.6 g/dL (ref 12.0–15.0)
Lymphocytes Relative: 21.6 % (ref 12.0–46.0)
Lymphs Abs: 1.2 10*3/uL (ref 0.7–4.0)
MCHC: 33.2 g/dL (ref 30.0–36.0)
MCV: 96.3 fl (ref 78.0–100.0)
Monocytes Absolute: 0.5 10*3/uL (ref 0.1–1.0)
Monocytes Relative: 8.1 % (ref 3.0–12.0)
Neutro Abs: 3.9 10*3/uL (ref 1.4–7.7)
Neutrophils Relative %: 66.8 % (ref 43.0–77.0)
Platelets: 313 10*3/uL (ref 150.0–400.0)
RBC: 4.56 Mil/uL (ref 3.87–5.11)
RDW: 13 % (ref 11.5–15.5)
WBC: 5.8 10*3/uL (ref 4.0–10.5)

## 2024-01-27 LAB — LIPID PANEL
Cholesterol: 157 mg/dL (ref 0–200)
HDL: 70.3 mg/dL (ref >39.00)
LDL Cholesterol: 66 mg/dL (ref 0–99)
NonHDL: 86.47
Total CHOL/HDL Ratio: 2
Triglycerides: 101 mg/dL (ref 0.0–149.0)
VLDL: 20.2 mg/dL (ref 0.0–40.0)

## 2024-01-27 LAB — VITAMIN B12: Vitamin B-12: 651 pg/mL (ref 211–911)

## 2024-01-27 LAB — VITAMIN D 25 HYDROXY (VIT D DEFICIENCY, FRACTURES): VITD: 79.33 ng/mL (ref 30.00–100.00)

## 2024-01-27 LAB — TSH: TSH: 1.42 u[IU]/mL (ref 0.35–5.50)

## 2024-01-27 MED ORDER — ESTRADIOL 1 MG PO TABS
1.0000 mg | ORAL_TABLET | Freq: Every day | ORAL | 1 refills | Status: AC
Start: 2024-01-27 — End: ?
  Filled 2024-01-27: qty 90, 90d supply, fill #0
  Filled 2024-02-11: qty 30, 30d supply, fill #0
  Filled 2024-03-11: qty 30, 30d supply, fill #1
  Filled 2024-04-10: qty 30, 30d supply, fill #2
  Filled 2024-05-07: qty 30, 30d supply, fill #3
  Filled 2024-10-28: qty 30, 30d supply, fill #4

## 2024-01-27 MED ORDER — PANTOPRAZOLE SODIUM 40 MG PO TBEC
40.0000 mg | DELAYED_RELEASE_TABLET | Freq: Two times a day (BID) | ORAL | 1 refills | Status: AC
  Filled 2024-01-27: qty 180, 90d supply, fill #0
  Filled 2024-02-11: qty 60, 30d supply, fill #0
  Filled 2024-03-11: qty 60, 30d supply, fill #1
  Filled 2024-04-10: qty 60, 30d supply, fill #2
  Filled 2024-05-07: qty 60, 30d supply, fill #3
  Filled 2024-06-06: qty 60, 30d supply, fill #4
  Filled 2024-07-06: qty 60, 30d supply, fill #5

## 2024-01-27 MED ORDER — PROGESTERONE MICRONIZED 100 MG PO CAPS
100.0000 mg | ORAL_CAPSULE | Freq: Every day | ORAL | 1 refills | Status: AC
  Filled 2024-01-27: qty 90, 90d supply, fill #0
  Filled 2024-02-11: qty 30, 30d supply, fill #0
  Filled 2024-03-11: qty 30, 30d supply, fill #1
  Filled 2024-04-10: qty 30, 30d supply, fill #2
  Filled 2024-05-07: qty 30, 30d supply, fill #3
  Filled 2024-08-03 – 2024-09-21 (×4): qty 30, 30d supply, fill #4

## 2024-01-27 NOTE — Progress Notes (Signed)
 Established Patient Office Visit     CC/Reason for Visit: Annual preventive exam  HPI: Monique Wang is a 58 y.o. female who is coming in today for the above mentioned reasons. Past Medical History is significant for: GERD, mild ILD post COVID.  She is also on hormone replacement therapy due to postmenopausal vasomotor symptoms.  She is feeling well without concerns or complaints.  Has routine eye and dental care.  Follows with GYN routinely.   Past Medical/Surgical History: Past Medical History:  Diagnosis Date   Allergy    environmental   Bronchiectasis (HCC)    COVID-19 01/2020   Dysplastic nevus    Back   GERD (gastroesophageal reflux disease)    Pneumonia     Past Surgical History:  Procedure Laterality Date   CARPAL TUNNEL RELEASE Right    ganglion cyst removal   CESAREAN SECTION     HERNIA REPAIR     SHOULDER SURGERY     uterine ablation      Social History:  reports that she has never smoked. She has never used smokeless tobacco. She reports that she does not drink alcohol and does not use drugs.  Allergies: Allergies  Allergen Reactions   Honey Bee Venom Protein [Bee Venom] Anaphylaxis   Grass Pollen(K-O-R-T-Swt Vern) Cough, Other (See Comments) and Rash   Erythromycin Rash    Family History:  Family History  Problem Relation Age of Onset   Hypertension Mother    Anxiety disorder Mother    Arthritis Mother    Hyperlipidemia Father    Stroke Father    Colon polyps Father    Colon cancer Father    Cancer Father    Depression Father    Heart disease Father    Colon cancer Paternal Grandmother    Cancer Paternal Grandmother    Depression Maternal Grandfather    Heart disease Maternal Grandfather    Heart disease Maternal Grandmother    Hypertension Maternal Grandmother    Stroke Maternal Grandmother    Cancer Brother    Hearing loss Brother    Heart disease Maternal Uncle    Intellectual disability Brother    Esophageal cancer Neg Hx     Stomach cancer Neg Hx    Rectal cancer Neg Hx      Current Outpatient Medications:    albuterol  (VENTOLIN  HFA) 108 (90 Base) MCG/ACT inhaler, Inhale 2 puffs into the lungs every 6 (six) hours as needed for wheezing or shortness of breath., Disp: 8.5 g, Rfl: 2   D 2000 50 MCG (2000 UT) TABS, Take 1 tablet by mouth daily., Disp: , Rfl:    EPINEPHrine 0.3 mg/0.3 mL IJ SOAJ injection, Inject 0.3 mg into the muscle as needed for anaphylaxis., Disp: , Rfl:    Multiple Vitamin (MULTIVITAMIN) tablet, Take 1 tablet by mouth daily., Disp: , Rfl:    scopolamine  (TRANSDERM-SCOP, 1.5 MG,) 1 MG/3DAYS, Place 1 patch (1.5 mg total) onto the skin every 3 (three) days., Disp: 10 patch, Rfl: 12   triamcinolone  cream (KENALOG ) 0.1 %, Apply twice a day as needed for rash to affected areas up to 2 weeks. Avoid applying to face, groin, and axilla., Disp: 80 g, Rfl: 2   estradiol  (ESTRACE ) 1 MG tablet, Take 1 tablet (1 mg total) by mouth daily., Disp: 90 tablet, Rfl: 1   pantoprazole  (PROTONIX ) 40 MG tablet, Take 1 tablet (40 mg total) by mouth 2 (two) times daily., Disp: 180 tablet, Rfl: 1  progesterone  (PROMETRIUM ) 100 MG capsule, Take 1 capsule (100 mg total) by mouth daily., Disp: 90 capsule, Rfl: 1  Review of Systems:  Negative unless indicated in HPI.   Physical Exam: Vitals:   01/27/24 0720  BP: 120/80  Pulse: 76  Temp: 98.1 F (36.7 C)  TempSrc: Oral  SpO2: 94%  Weight: 163 lb (73.9 kg)  Height: 5\' 3"  (1.6 m)    Body mass index is 28.87 kg/m.   Physical Exam Vitals reviewed.  Constitutional:      General: She is not in acute distress.    Appearance: Normal appearance. She is not ill-appearing, toxic-appearing or diaphoretic.  HENT:     Head: Normocephalic.     Right Ear: Tympanic membrane, ear canal and external ear normal. There is no impacted cerumen.     Left Ear: Tympanic membrane, ear canal and external ear normal. There is no impacted cerumen.     Nose: Nose normal.      Mouth/Throat:     Mouth: Mucous membranes are moist.     Pharynx: Oropharynx is clear. No oropharyngeal exudate or posterior oropharyngeal erythema.  Eyes:     General: No scleral icterus.       Right eye: No discharge.        Left eye: No discharge.     Conjunctiva/sclera: Conjunctivae normal.     Pupils: Pupils are equal, round, and reactive to light.  Neck:     Vascular: No carotid bruit.  Cardiovascular:     Rate and Rhythm: Normal rate and regular rhythm.     Pulses: Normal pulses.     Heart sounds: Normal heart sounds.  Pulmonary:     Effort: Pulmonary effort is normal. No respiratory distress.     Breath sounds: Normal breath sounds.  Abdominal:     General: Abdomen is flat. Bowel sounds are normal.     Palpations: Abdomen is soft.  Musculoskeletal:        General: Normal range of motion.     Cervical back: Normal range of motion.  Skin:    General: Skin is warm and dry.  Neurological:     General: No focal deficit present.     Mental Status: She is alert and oriented to person, place, and time. Mental status is at baseline.  Psychiatric:        Mood and Affect: Mood normal.        Behavior: Behavior normal.        Thought Content: Thought content normal.        Judgment: Judgment normal.       Impression and Plan:  Encounter for preventive health examination -     CBC with Differential/Platelet; Future -     Comprehensive metabolic panel with GFR; Future -     Lipid panel; Future -     TSH; Future -     Vitamin B12; Future -     VITAMIN D  25 Hydroxy (Vit-D Deficiency, Fractures); Future  Gastroesophageal reflux disease, unspecified whether esophagitis present  Vasomotor symptoms due to menopause -     Progesterone ; Take 1 capsule (100 mg total) by mouth daily.  Dispense: 90 capsule; Refill: 1 -     Estradiol ; Take 1 tablet (1 mg total) by mouth daily.  Dispense: 90 tablet; Refill: 1  Encounter for hepatitis C screening test for low risk patient -      Hepatitis C antibody; Future  Encounter for screening for HIV -  HIV Antibody (routine testing w rflx); Future  Gastroesophageal reflux disease without esophagitis -     Pantoprazole  Sodium; Take 1 tablet (40 mg total) by mouth 2 (two) times daily.  Dispense: 180 tablet; Refill: 1   -Recommend routine eye and dental care. -Healthy lifestyle discussed in detail. -Labs to be updated today. -Prostate cancer screening: Not applicable Health Maintenance  Topic Date Due   HIV Screening  Never done   Hepatitis C Screening  Never done   Mammogram  03/23/2022   Pap with HPV screening  06/06/2022   COVID-19 Vaccine (5 - 2024-25 season) 02/12/2024*   Flu Shot  04/23/2024   Colon Cancer Screening  05/22/2030   DTaP/Tdap/Td vaccine (4 - Td or Tdap) 01/05/2033   Pneumococcal Vaccination  Completed   Zoster (Shingles) Vaccine  Completed   HPV Vaccine  Aged Out   Meningitis B Vaccine  Aged Out  *Topic was postponed. The date shown is not the original due date.    - All immunizations and cancer screenings are up-to-date.     Marguerita Shih, MD [facility] at [facility]

## 2024-01-28 LAB — HEPATITIS C ANTIBODY: Hepatitis C Ab: NONREACTIVE

## 2024-01-28 LAB — HIV ANTIBODY (ROUTINE TESTING W REFLEX): HIV 1&2 Ab, 4th Generation: NONREACTIVE

## 2024-01-29 ENCOUNTER — Encounter: Payer: Self-pay | Admitting: Internal Medicine

## 2024-02-11 ENCOUNTER — Other Ambulatory Visit: Payer: Self-pay

## 2024-02-11 ENCOUNTER — Other Ambulatory Visit (HOSPITAL_COMMUNITY): Payer: Self-pay

## 2024-03-05 ENCOUNTER — Other Ambulatory Visit (HOSPITAL_BASED_OUTPATIENT_CLINIC_OR_DEPARTMENT_OTHER): Payer: Self-pay

## 2024-03-05 DIAGNOSIS — M545 Low back pain, unspecified: Secondary | ICD-10-CM | POA: Diagnosis not present

## 2024-03-05 MED ORDER — PREDNISONE 10 MG (21) PO TBPK
ORAL_TABLET | ORAL | 0 refills | Status: AC
  Filled 2024-03-05: qty 21, 6d supply, fill #0

## 2024-03-05 MED ORDER — TIZANIDINE HCL 4 MG PO TABS
4.0000 mg | ORAL_TABLET | Freq: Three times a day (TID) | ORAL | 0 refills | Status: AC | PRN
  Filled 2024-03-05: qty 21, 7d supply, fill #0

## 2024-03-11 ENCOUNTER — Other Ambulatory Visit (HOSPITAL_COMMUNITY): Payer: Self-pay

## 2024-03-23 ENCOUNTER — Encounter: Payer: Self-pay | Admitting: Internal Medicine

## 2024-03-24 ENCOUNTER — Other Ambulatory Visit: Payer: Self-pay

## 2024-03-24 ENCOUNTER — Other Ambulatory Visit: Payer: Self-pay | Admitting: Internal Medicine

## 2024-03-24 ENCOUNTER — Other Ambulatory Visit (HOSPITAL_COMMUNITY): Payer: Self-pay

## 2024-03-24 DIAGNOSIS — T753XXD Motion sickness, subsequent encounter: Secondary | ICD-10-CM

## 2024-03-24 MED ORDER — SCOPOLAMINE 1 MG/3DAYS TD PT72
1.0000 | MEDICATED_PATCH | TRANSDERMAL | 12 refills | Status: AC
  Filled 2024-03-24 (×2): qty 10, 30d supply, fill #0

## 2024-04-10 ENCOUNTER — Other Ambulatory Visit (HOSPITAL_COMMUNITY): Payer: Self-pay

## 2024-05-07 ENCOUNTER — Other Ambulatory Visit (HOSPITAL_COMMUNITY): Payer: Self-pay

## 2024-05-30 DIAGNOSIS — R509 Fever, unspecified: Secondary | ICD-10-CM | POA: Diagnosis not present

## 2024-05-30 DIAGNOSIS — Z20822 Contact with and (suspected) exposure to covid-19: Secondary | ICD-10-CM | POA: Diagnosis not present

## 2024-05-30 DIAGNOSIS — R5383 Other fatigue: Secondary | ICD-10-CM | POA: Diagnosis not present

## 2024-05-31 ENCOUNTER — Other Ambulatory Visit (HOSPITAL_BASED_OUTPATIENT_CLINIC_OR_DEPARTMENT_OTHER): Payer: Self-pay

## 2024-05-31 DIAGNOSIS — Z01419 Encounter for gynecological examination (general) (routine) without abnormal findings: Secondary | ICD-10-CM | POA: Diagnosis not present

## 2024-05-31 DIAGNOSIS — R319 Hematuria, unspecified: Secondary | ICD-10-CM | POA: Diagnosis not present

## 2024-05-31 DIAGNOSIS — Z6830 Body mass index (BMI) 30.0-30.9, adult: Secondary | ICD-10-CM | POA: Diagnosis not present

## 2024-05-31 DIAGNOSIS — Z1231 Encounter for screening mammogram for malignant neoplasm of breast: Secondary | ICD-10-CM | POA: Diagnosis not present

## 2024-05-31 MED ORDER — ESTRADIOL 1 MG PO TABS
1.0000 mg | ORAL_TABLET | Freq: Every day | ORAL | 3 refills | Status: AC
  Filled 2024-05-31: qty 30, 30d supply, fill #0
  Filled 2024-07-06: qty 30, 30d supply, fill #1
  Filled 2024-08-02: qty 30, 30d supply, fill #2
  Filled 2024-09-13: qty 30, 30d supply, fill #3

## 2024-05-31 MED ORDER — PROGESTERONE MICRONIZED 100 MG PO CAPS
100.0000 mg | ORAL_CAPSULE | Freq: Every day | ORAL | 3 refills | Status: AC
  Filled 2024-05-31: qty 30, 30d supply, fill #0
  Filled 2024-07-06: qty 30, 30d supply, fill #1
  Filled 2024-08-02: qty 16, 16d supply, fill #2
  Filled 2024-08-03: qty 14, 14d supply, fill #2
  Filled 2024-09-01: qty 30, 30d supply, fill #3
  Filled 2024-10-01: qty 30, 30d supply, fill #4
  Filled 2024-10-28: qty 30, 30d supply, fill #5

## 2024-06-02 DIAGNOSIS — D2271 Melanocytic nevi of right lower limb, including hip: Secondary | ICD-10-CM | POA: Diagnosis not present

## 2024-06-02 DIAGNOSIS — D2261 Melanocytic nevi of right upper limb, including shoulder: Secondary | ICD-10-CM | POA: Diagnosis not present

## 2024-06-02 DIAGNOSIS — L821 Other seborrheic keratosis: Secondary | ICD-10-CM | POA: Diagnosis not present

## 2024-06-02 DIAGNOSIS — D225 Melanocytic nevi of trunk: Secondary | ICD-10-CM | POA: Diagnosis not present

## 2024-06-06 DIAGNOSIS — J019 Acute sinusitis, unspecified: Secondary | ICD-10-CM | POA: Diagnosis not present

## 2024-06-06 DIAGNOSIS — R051 Acute cough: Secondary | ICD-10-CM | POA: Diagnosis not present

## 2024-06-09 ENCOUNTER — Other Ambulatory Visit (HOSPITAL_COMMUNITY): Payer: Self-pay

## 2024-07-06 ENCOUNTER — Other Ambulatory Visit: Payer: Self-pay | Admitting: Internal Medicine

## 2024-07-06 DIAGNOSIS — U071 COVID-19: Secondary | ICD-10-CM

## 2024-07-07 ENCOUNTER — Other Ambulatory Visit (HOSPITAL_COMMUNITY): Payer: Self-pay

## 2024-07-07 ENCOUNTER — Other Ambulatory Visit: Payer: Self-pay

## 2024-07-08 MED ORDER — ALBUTEROL SULFATE HFA 108 (90 BASE) MCG/ACT IN AERS
2.0000 | INHALATION_SPRAY | Freq: Four times a day (QID) | RESPIRATORY_TRACT | 2 refills | Status: AC | PRN
  Filled 2024-07-08: qty 6.7, 25d supply, fill #0

## 2024-07-09 ENCOUNTER — Other Ambulatory Visit (HOSPITAL_COMMUNITY): Payer: Self-pay

## 2024-07-09 ENCOUNTER — Other Ambulatory Visit: Payer: Self-pay

## 2024-08-02 ENCOUNTER — Other Ambulatory Visit: Payer: Self-pay

## 2024-08-03 ENCOUNTER — Other Ambulatory Visit: Payer: Self-pay

## 2024-08-03 ENCOUNTER — Other Ambulatory Visit (HOSPITAL_COMMUNITY): Payer: Self-pay

## 2024-08-04 ENCOUNTER — Other Ambulatory Visit: Payer: Self-pay

## 2024-09-13 ENCOUNTER — Other Ambulatory Visit: Payer: Self-pay

## 2024-09-14 ENCOUNTER — Other Ambulatory Visit: Payer: Self-pay

## 2024-09-22 ENCOUNTER — Other Ambulatory Visit: Payer: Self-pay

## 2024-10-29 ENCOUNTER — Other Ambulatory Visit: Payer: Self-pay

## 2024-10-29 ENCOUNTER — Other Ambulatory Visit (HOSPITAL_COMMUNITY): Payer: Self-pay

## 2025-01-31 ENCOUNTER — Encounter: Admitting: Internal Medicine
